# Patient Record
Sex: Male | Born: 1950
Health system: Southern US, Community
[De-identification: ages and names within clinical notes are randomized; demographics above are authoritative.]

## PROBLEM LIST (undated history)

## (undated) DIAGNOSIS — I10 Essential (primary) hypertension: Secondary | ICD-10-CM

## (undated) DIAGNOSIS — IMO0001 Reserved for inherently not codable concepts without codable children: Secondary | ICD-10-CM

## (undated) DIAGNOSIS — Z531 Procedure and treatment not carried out because of patient's decision for reasons of belief and group pressure: Secondary | ICD-10-CM

## (undated) DIAGNOSIS — I82419 Acute embolism and thrombosis of unspecified femoral vein: Secondary | ICD-10-CM

## (undated) HISTORY — DX: Essential (primary) hypertension: I10

## (undated) HISTORY — DX: Procedure and treatment not carried out because of patient's decision for reasons of belief and group pressure: Z53.1

## (undated) HISTORY — PX: APPENDECTOMY: SHX54

## (undated) HISTORY — DX: Reserved for inherently not codable concepts without codable children: IMO0001

---

## 2009-11-02 ENCOUNTER — Ambulatory Visit: Payer: Self-pay | Admitting: Family Medicine

## 2009-11-02 DIAGNOSIS — E669 Obesity, unspecified: Secondary | ICD-10-CM

## 2009-11-02 DIAGNOSIS — R030 Elevated blood-pressure reading, without diagnosis of hypertension: Secondary | ICD-10-CM | POA: Insufficient documentation

## 2009-11-02 LAB — CONVERTED CEMR LAB
ALT: 19 units/L (ref 0–53)
Alkaline Phosphatase: 94 units/L (ref 39–117)
Blood in Urine, dipstick: NEGATIVE
Glucose, Bld: 140 mg/dL — ABNORMAL HIGH (ref 70–99)
Ketones, urine, test strip: NEGATIVE
LDL Cholesterol: 194 mg/dL — ABNORMAL HIGH (ref 0–99)
Nitrite: NEGATIVE
Sodium: 136 meq/L (ref 135–145)
Total Bilirubin: 0.8 mg/dL (ref 0.3–1.2)
Total Protein: 7.4 g/dL (ref 6.0–8.3)
Triglycerides: 96 mg/dL (ref <150)
Urobilinogen, UA: 0.2
VLDL: 19 mg/dL (ref 0–40)
WBC Urine, dipstick: NEGATIVE

## 2010-07-27 NOTE — Assessment & Plan Note (Signed)
Summary: NP,tcb   Vital Signs:  Patient profile:   60 year old male Height:      72 inches Weight:      294.6 pounds BMI:     40.10 Temp:     98.0 degrees F oral Pulse rate:   64 / minute BP sitting:   171 / 99  (left arm) Cuff size:   large  Vitals Entered By: Garen Grams LPN (Nov 02, 6604 9:01 AM)  Serial Vital Signs/Assessments:  Comments: 9:02 AM Manual BP: 168/100 By: Garen Grams LPN   CC: New Patient Is Patient Diabetic? No Pain Assessment Patient in pain? no        CC:  New Patient.  History of Present Illness: For Physical Feels well   Elevated blood pressure was noted when he had it check by nurse at [facility].  Never had hypertension before but has been years since checked.  Does have family history of hypertension Does not add salt but on no special diet.   No leg swelling or urine changes  .  Habits & Providers  Alcohol-Tobacco-Diet     Alcohol drinks/day: <1     Tobacco Status: never  Exercise-Depression-Behavior     Does Patient Exercise: no  Past History:  Past Medical History: No chronic illnesses 10-2009 Appendectomy 1976 DVT in 1970s wears compression stocking  Family History: Family History Diabetes 1st degree relative - Mother HTN in father who died of CAD in his 39s  Social History: Lives with wife Tyrian Peart.  2 adult children.  2 grandchildren 2005 and 2006.  Works as Teaching laboratory technician at [employer].  Does not exercise regularly Smoking Status:  never Does Patient Exercise:  no  Review of Systems  The patient denies anorexia, fever, weight loss, vision loss, decreased hearing, hoarseness, chest pain, dyspnea on exertion, peripheral edema, prolonged cough, headaches, hemoptysis, abdominal pain, melena, hematochezia, severe indigestion/heartburn, hematuria, incontinence, genital sores, muscle weakness, suspicious skin lesions, transient blindness, difficulty walking, depression, unusual weight change, abnormal bleeding, and  enlarged lymph nodes.    Physical Exam  General:  Well-developed,well-nourished,in no acute distress; alert,appropriate and cooperative throughout examination Head:  Normocephalic and atraumatic without obvious abnormalities. No apparent alopecia or balding. Nose:  External nasal examination shows no deformity or inflammation. Nasal mucosa are pink and moist without lesions or exudates. Mouth:  Oral mucosa and oropharynx without lesions or exudates. Missing a number of teeth Neck:  No deformities, masses, or tenderness noted. Lungs:  Normal respiratory effort, chest expands symmetrically. Lungs are clear to auscultation, no crackles or wheezes. Heart:  Normal rate and regular rhythm. S1 and S2 normal without gallop, murmur, click, rub or other extra sounds. Abdomen:  Bowel sounds positive,abdomen soft and non-tender without masses, organomegaly or hernias noted. Obese Rectal:  No external abnormalities noted. Normal sphincter tone. No rectal masses or tenderness. Genitalia:  Testes bilaterally descended without nodularity, tenderness or masses. No scrotal masses or lesions. No penis lesions or urethral discharge. Prostate:  Prostate gland firm and smooth, no enlargement, nodularity, tenderness, mass, asymmetry or induration. Msk:  No deformity or scoliosis noted of thoracic or lumbar spine.   Extremities:  No clubbing, cyanosis, edema, or deformity noted with normal full range of motion of all joints.    Left leg with compression stocking Skin:  Intact without suspicious lesions or rashes Cervical Nodes:  No lymphadenopathy noted   Impression & Recommendations:  Problem # 1:  ELEVATED BLOOD PRESSURE WITHOUT DIAGNOSIS OF HYPERTENSION (ICD-796.2) Very likely  has hypertension.  Will check labs and monitor readings at home  Comp Met-[facility] [phone]) Urinalysis-[facility] (00000)  Problem # 2:  Preventive Health Care (ICD-V70.0) check PSA,  Gave colonoscopy handout.  Check cholesterol today    Problem # 3:  OBESITY (ICD-278.00) discussed importance to weight loss and diet control   Other Orders: PSA-[facility] ([id number]) Lipid-[facility] ([id number])   Patient Instructions: 1)  Please schedule a follow-up appointment in 1 month.  2)  Write down your blood pressure both at home and at work and bring in next visit.  If regularly > 160/95 call us 3)  Cut back on salt and calories 4)  It is important that you exercise reguarly at least 20 minutes 5 times a week. If you develop chest pain, have severe difficulty breathing, or feel very tired, stop exercising immediately and seek medical attention.  5)  I will call you if your lab is abnormal otherwise I will discuss the results during our next visit   Prevention & Chronic Care Immunizations   Influenza vaccine: Not documented    Tetanus booster: Not documented    Pneumococcal vaccine: Not documented  Colorectal Screening   Hemoccult: Not documented    Colonoscopy: Not documented  Other Screening   PSA: Not documented   PSA ordered.   Smoking status: never  (11/02/2009)  Lipids   Total Cholesterol: Not documented   LDL: Not documented   LDL Direct: Not documented   HDL: Not documented   Triglycerides: Not documented   Laboratory Results   Urine Tests  Date/Time Received: Nov 02, 2009 10:23 AM  Date/Time Reported: Nov 02, 2009 10:24 AM   Routine Urinalysis   Color: yellow Appearance: Clear Glucose: negative   (Normal Range: Negative) Bilirubin: negative   (Normal Range: Negative) Ketone: negative   (Normal Range: Negative) Spec. Gravity: 1.025   (Normal Range: 1.003-1.035) Blood: negative   (Normal Range: Negative) pH: 5.5   (Normal Range: 5.0-8.0) Protein: trace   (Normal Range: Negative) Urobilinogen: 0.2   (Normal Range: 0-1) Nitrite: negative   (Normal Range: Negative) Leukocyte Esterace: negative   (Normal Range: Negative)    Comments: ...........test performed by...........Marland KitchenTerese Door,  CMA

## 2012-09-07 ENCOUNTER — Emergency Department (HOSPITAL_COMMUNITY): Payer: Worker's Compensation

## 2012-09-07 ENCOUNTER — Encounter (HOSPITAL_COMMUNITY): Admission: EM | Disposition: A | Discharge status: Home Health | Payer: Self-pay | Source: Home / Self Care

## 2012-09-07 ENCOUNTER — Inpatient Hospital Stay (HOSPITAL_COMMUNITY): Payer: Worker's Compensation | Admitting: Anesthesiology

## 2012-09-07 ENCOUNTER — Encounter (HOSPITAL_COMMUNITY): Payer: Self-pay | Admitting: Anesthesiology

## 2012-09-07 ENCOUNTER — Inpatient Hospital Stay (HOSPITAL_COMMUNITY)
Admission: EM | Admit: 2012-09-07 | Discharge: 2012-09-14 | DRG: 026 | Disposition: A | Discharge status: Home Health | Payer: Worker's Compensation | Attending: General Surgery | Admitting: General Surgery

## 2012-09-07 ENCOUNTER — Encounter (HOSPITAL_COMMUNITY): Payer: Self-pay | Admitting: Emergency Medicine

## 2012-09-07 DIAGNOSIS — Y9269 Other specified industrial and construction area as the place of occurrence of the external cause: Secondary | ICD-10-CM

## 2012-09-07 DIAGNOSIS — S51809A Unspecified open wound of unspecified forearm, initial encounter: Secondary | ICD-10-CM | POA: Diagnosis present

## 2012-09-07 DIAGNOSIS — S2249XA Multiple fractures of ribs, unspecified side, initial encounter for closed fracture: Secondary | ICD-10-CM

## 2012-09-07 DIAGNOSIS — S2241XA Multiple fractures of ribs, right side, initial encounter for closed fracture: Secondary | ICD-10-CM

## 2012-09-07 DIAGNOSIS — S066X9A Traumatic subarachnoid hemorrhage with loss of consciousness of unspecified duration, initial encounter: Secondary | ICD-10-CM

## 2012-09-07 DIAGNOSIS — W19XXXA Unspecified fall, initial encounter: Secondary | ICD-10-CM

## 2012-09-07 DIAGNOSIS — S020XXB Fracture of vault of skull, initial encounter for open fracture: Secondary | ICD-10-CM

## 2012-09-07 DIAGNOSIS — T07XXXA Unspecified multiple injuries, initial encounter: Secondary | ICD-10-CM

## 2012-09-07 DIAGNOSIS — W1789XA Other fall from one level to another, initial encounter: Secondary | ICD-10-CM | POA: Diagnosis present

## 2012-09-07 DIAGNOSIS — S0180XA Unspecified open wound of other part of head, initial encounter: Secondary | ICD-10-CM | POA: Diagnosis present

## 2012-09-07 DIAGNOSIS — S069X9A Unspecified intracranial injury with loss of consciousness of unspecified duration, initial encounter: Secondary | ICD-10-CM

## 2012-09-07 DIAGNOSIS — S06369A Traumatic hemorrhage of cerebrum, unspecified, with loss of consciousness of unspecified duration, initial encounter: Secondary | ICD-10-CM

## 2012-09-07 DIAGNOSIS — S0190XA Unspecified open wound of unspecified part of head, initial encounter: Secondary | ICD-10-CM

## 2012-09-07 DIAGNOSIS — S0291XB Unspecified fracture of skull, initial encounter for open fracture: Secondary | ICD-10-CM

## 2012-09-07 DIAGNOSIS — Z8679 Personal history of other diseases of the circulatory system: Secondary | ICD-10-CM

## 2012-09-07 DIAGNOSIS — S066XAA Traumatic subarachnoid hemorrhage with loss of consciousness status unknown, initial encounter: Secondary | ICD-10-CM

## 2012-09-07 DIAGNOSIS — Y990 Civilian activity done for income or pay: Secondary | ICD-10-CM

## 2012-09-07 DIAGNOSIS — W12XXXA Fall on and from scaffolding, initial encounter: Secondary | ICD-10-CM

## 2012-09-07 HISTORY — DX: Acute embolism and thrombosis of unspecified femoral vein: I82.419

## 2012-09-07 HISTORY — PX: CRANIOTOMY: SHX93

## 2012-09-07 LAB — CBC WITH DIFFERENTIAL/PLATELET
Basophils Absolute: 0.1 K/uL (ref 0.0–0.1)
Basophils Relative: 1 % (ref 0–1)
Eosinophils Absolute: 0.2 10*3/uL (ref 0.0–0.7)
Eosinophils Relative: 3 % (ref 0–5)
HCT: 42.1 % (ref 39.0–52.0)
Hemoglobin: 14.9 g/dL (ref 13.0–17.0)
Lymphocytes Relative: 48 % — ABNORMAL HIGH (ref 12–46)
Lymphs Abs: 4.4 K/uL — ABNORMAL HIGH (ref 0.7–4.0)
MCH: 28.8 pg (ref 26.0–34.0)
MCHC: 35.4 g/dL (ref 30.0–36.0)
MCV: 81.3 fL (ref 78.0–100.0)
Monocytes Absolute: 0.5 10*3/uL (ref 0.1–1.0)
Monocytes Relative: 5 % (ref 3–12)
Neutro Abs: 4 K/uL (ref 1.7–7.7)
Neutrophils Relative %: 44 % (ref 43–77)
Platelets: 199 K/uL (ref 150–400)
RBC: 5.18 MIL/uL (ref 4.22–5.81)
RDW: 12.8 % (ref 11.5–15.5)
WBC: 9.1 K/uL (ref 4.0–10.5)

## 2012-09-07 LAB — APTT: aPTT: 29 seconds (ref 24–37)

## 2012-09-07 LAB — COMPREHENSIVE METABOLIC PANEL WITH GFR
ALT: 37 U/L (ref 0–53)
AST: 65 U/L — ABNORMAL HIGH (ref 0–37)
Alkaline Phosphatase: 102 U/L (ref 39–117)
CO2: 22 meq/L (ref 19–32)
Chloride: 102 meq/L (ref 96–112)
GFR calc Af Amer: 89 mL/min — ABNORMAL LOW (ref >90)
GFR calc non Af Amer: 77 mL/min — ABNORMAL LOW (ref >90)
Glucose, Bld: 143 mg/dL — ABNORMAL HIGH (ref 70–99)
Potassium: 3.9 meq/L (ref 3.5–5.1)
Sodium: 137 meq/L (ref 135–145)
Total Bilirubin: 0.6 mg/dL (ref 0.3–1.2)

## 2012-09-07 LAB — URINALYSIS, ROUTINE W REFLEX MICROSCOPIC
Bilirubin Urine: NEGATIVE
Glucose, UA: NEGATIVE mg/dL
Hgb urine dipstick: NEGATIVE
Ketones, ur: 15 mg/dL — AB
Leukocytes, UA: NEGATIVE
Nitrite: NEGATIVE
Protein, ur: NEGATIVE mg/dL
Specific Gravity, Urine: 1.034 — ABNORMAL HIGH (ref 1.005–1.030)
Urobilinogen, UA: 1 mg/dL (ref 0.0–1.0)
pH: 5.5 (ref 5.0–8.0)

## 2012-09-07 LAB — PROTIME-INR
INR: 1.03 (ref 0.00–1.49)
Prothrombin Time: 13.4 s (ref 11.6–15.2)

## 2012-09-07 LAB — COMPREHENSIVE METABOLIC PANEL
Albumin: 3.8 g/dL (ref 3.5–5.2)
BUN: 15 mg/dL (ref 6–23)
Calcium: 8.7 mg/dL (ref 8.4–10.5)
Creatinine, Ser: 1.03 mg/dL (ref 0.50–1.35)
Total Protein: 7.5 g/dL (ref 6.0–8.3)

## 2012-09-07 LAB — SAMPLE TO BLOOD BANK

## 2012-09-07 SURGERY — CRANIOTOMY HEMATOMA EVACUATION SUBDURAL
Anesthesia: General | Site: Head | Laterality: Right | Wound class: Dirty or Infected

## 2012-09-07 MED ORDER — POLYETHYLENE GLYCOL 3350 17 G PO PACK
17.0000 g | PACK | Freq: Every day | ORAL | Status: DC
  Administered 2012-09-08 – 2012-09-11 (×4): 17 g via ORAL
  Filled 2012-09-07 (×8): qty 1

## 2012-09-07 MED ORDER — GLYCOPYRROLATE 0.2 MG/ML IJ SOLN
INTRAMUSCULAR | Status: DC | PRN
  Administered 2012-09-07: .6 mg via INTRAVENOUS

## 2012-09-07 MED ORDER — 0.9 % SODIUM CHLORIDE (POUR BTL) OPTIME
TOPICAL | Status: DC | PRN
  Administered 2012-09-07 (×2): 1000 mL

## 2012-09-07 MED ORDER — NALOXONE HCL 0.4 MG/ML IJ SOLN: 0.4000 mg | INTRAMUSCULAR | Status: DC | PRN

## 2012-09-07 MED ORDER — PHENYLEPHRINE HCL 10 MG/ML IJ SOLN
INTRAMUSCULAR | Status: DC | PRN
  Administered 2012-09-07 (×2): 40 ug via INTRAVENOUS

## 2012-09-07 MED ORDER — LABETALOL HCL 5 MG/ML IV SOLN
INTRAVENOUS | Status: DC | PRN
  Administered 2012-09-07 (×2): 5 mg via INTRAVENOUS

## 2012-09-07 MED ORDER — LIDOCAINE HCL (PF) 1 % IJ SOLN
INTRAMUSCULAR | Status: AC
  Filled 2012-09-07: qty 5

## 2012-09-07 MED ORDER — BACITRACIN ZINC 500 UNIT/GM EX OINT
TOPICAL_OINTMENT | CUTANEOUS | Status: DC | PRN
  Administered 2012-09-07: 1 via TOPICAL

## 2012-09-07 MED ORDER — THROMBIN 20000 UNITS EX SOLR
CUTANEOUS | Status: DC | PRN
  Administered 2012-09-07: 18:00:00 via TOPICAL

## 2012-09-07 MED ORDER — FENTANYL CITRATE 0.05 MG/ML IJ SOLN: 100.0000 ug | Freq: Once | INTRAMUSCULAR | Status: DC

## 2012-09-07 MED ORDER — ONDANSETRON HCL 4 MG/2ML IJ SOLN: 4.0000 mg | Freq: Once | INTRAMUSCULAR | Status: DC | PRN

## 2012-09-07 MED ORDER — ONDANSETRON HCL 4 MG/2ML IJ SOLN
INTRAMUSCULAR | Status: DC | PRN
  Administered 2012-09-07: 4 mg via INTRAVENOUS

## 2012-09-07 MED ORDER — SODIUM CHLORIDE 0.9 % IV SOLN
INTRAVENOUS | Status: DC | PRN
  Administered 2012-09-07: 18:00:00 via INTRAVENOUS

## 2012-09-07 MED ORDER — LIDOCAINE HCL 4 % MT SOLN
OROMUCOSAL | Status: DC | PRN
  Administered 2012-09-07: 4 mL via TOPICAL

## 2012-09-07 MED ORDER — PROPOFOL 10 MG/ML IV BOLUS
INTRAVENOUS | Status: DC | PRN
  Administered 2012-09-07: 240 mg via INTRAVENOUS

## 2012-09-07 MED ORDER — SODIUM CHLORIDE 0.9 % IR SOLN
Status: DC | PRN
  Administered 2012-09-07: 18:00:00

## 2012-09-07 MED ORDER — LIDOCAINE HCL (CARDIAC) 20 MG/ML IV SOLN
INTRAVENOUS | Status: DC | PRN
  Administered 2012-09-07: 60 mg via INTRAVENOUS

## 2012-09-07 MED ORDER — ROCURONIUM BROMIDE 100 MG/10ML IV SOLN
INTRAVENOUS | Status: DC | PRN
  Administered 2012-09-07: 20 mg via INTRAVENOUS
  Administered 2012-09-07: 30 mg via INTRAVENOUS
  Administered 2012-09-07: 10 mg via INTRAVENOUS

## 2012-09-07 MED ORDER — DIPHENHYDRAMINE HCL 50 MG/ML IJ SOLN: 12.5000 mg | Freq: Four times a day (QID) | INTRAMUSCULAR | Status: DC | PRN

## 2012-09-07 MED ORDER — SODIUM CHLORIDE 0.9 % IV SOLN
INTRAVENOUS | Status: AC | PRN
  Administered 2012-09-07: 50 mL/h via INTRAVENOUS

## 2012-09-07 MED ORDER — TETANUS-DIPHTH-ACELL PERTUSSIS 5-2.5-18.5 LF-MCG/0.5 IM SUSP
0.5000 mL | Freq: Once | INTRAMUSCULAR | Status: AC
  Administered 2012-09-07: 0.5 mL via INTRAMUSCULAR
  Filled 2012-09-07: qty 0.5

## 2012-09-07 MED ORDER — EPHEDRINE SULFATE 50 MG/ML IJ SOLN
INTRAMUSCULAR | Status: DC | PRN
  Administered 2012-09-07 (×2): 5 mg via INTRAVENOUS

## 2012-09-07 MED ORDER — LIDOCAINE HCL (PF) 1 % IJ SOLN
INTRAMUSCULAR | Status: DC | PRN
  Administered 2012-09-07: 10 mL

## 2012-09-07 MED ORDER — HYDROMORPHONE HCL PF 1 MG/ML IJ SOLN: 0.2500 mg | INTRAMUSCULAR | Status: DC | PRN

## 2012-09-07 MED ORDER — FENTANYL CITRATE 0.05 MG/ML IJ SOLN
INTRAMUSCULAR | Status: AC | PRN
  Administered 2012-09-07: 100 ug via INTRAVENOUS

## 2012-09-07 MED ORDER — LIDOCAINE HCL (PF) 1 % IJ SOLN
INTRAMUSCULAR | Status: AC
  Administered 2012-09-07: 14:00:00
  Filled 2012-09-07: qty 5

## 2012-09-07 MED ORDER — HYDROMORPHONE HCL PF 1 MG/ML IJ SOLN
INTRAMUSCULAR | Status: AC
  Administered 2012-09-07: 1 mg via INTRAVENOUS
  Filled 2012-09-07: qty 1

## 2012-09-07 MED ORDER — NEOSTIGMINE METHYLSULFATE 1 MG/ML IJ SOLN
INTRAMUSCULAR | Status: DC | PRN
  Administered 2012-09-07: 4 mg via INTRAVENOUS

## 2012-09-07 MED ORDER — SODIUM CHLORIDE 0.9 % IJ SOLN: 9.0000 mL | INTRAMUSCULAR | Status: DC | PRN

## 2012-09-07 MED ORDER — DOCUSATE SODIUM 100 MG PO CAPS
100.0000 mg | ORAL_CAPSULE | Freq: Two times a day (BID) | ORAL | Status: DC
  Administered 2012-09-08 – 2012-09-11 (×7): 100 mg via ORAL
  Filled 2012-09-07 (×7): qty 1

## 2012-09-07 MED ORDER — IOHEXOL 300 MG/ML  SOLN
100.0000 mL | Freq: Once | INTRAMUSCULAR | Status: AC | PRN
  Administered 2012-09-07: 100 mL via INTRAVENOUS

## 2012-09-07 MED ORDER — POTASSIUM CHLORIDE IN NACL 20-0.9 MEQ/L-% IV SOLN
INTRAVENOUS | Status: DC
  Administered 2012-09-07: 20:00:00 via INTRAVENOUS
  Filled 2012-09-07 (×3): qty 1000

## 2012-09-07 MED ORDER — MICROFIBRILLAR COLL HEMOSTAT EX PADS
MEDICATED_PAD | CUTANEOUS | Status: DC | PRN
  Administered 2012-09-07: 1 via TOPICAL

## 2012-09-07 MED ORDER — DIPHENHYDRAMINE HCL 12.5 MG/5ML PO ELIX
12.5000 mg | ORAL_SOLUTION | Freq: Four times a day (QID) | ORAL | Status: DC | PRN
  Filled 2012-09-07: qty 5

## 2012-09-07 MED ORDER — HYDROMORPHONE 0.3 MG/ML IV SOLN
INTRAVENOUS | Status: DC
  Administered 2012-09-07: 22:00:00 via INTRAVENOUS
  Administered 2012-09-08 (×3): 0.9 mg via INTRAVENOUS
  Administered 2012-09-08: 0.6 mg via INTRAVENOUS
  Filled 2012-09-07: qty 25

## 2012-09-07 MED ORDER — DEXTROSE 5 % IV SOLN
1.0000 g | Freq: Once | INTRAVENOUS | Status: AC
  Administered 2012-09-07: 1 g via INTRAVENOUS
  Filled 2012-09-07: qty 10

## 2012-09-07 MED ORDER — ONDANSETRON HCL 4 MG/2ML IJ SOLN: 4.0000 mg | Freq: Four times a day (QID) | INTRAMUSCULAR | Status: DC | PRN

## 2012-09-07 MED ORDER — HYDROMORPHONE HCL PF 1 MG/ML IJ SOLN: 1.0000 mg | Freq: Once | INTRAMUSCULAR | Status: AC

## 2012-09-07 MED ORDER — LACTATED RINGERS IV SOLN
INTRAVENOUS | Status: DC | PRN
  Administered 2012-09-07: 17:00:00 via INTRAVENOUS

## 2012-09-07 MED ORDER — FENTANYL CITRATE 0.05 MG/ML IJ SOLN
INTRAMUSCULAR | Status: DC | PRN
  Administered 2012-09-07: 50 ug via INTRAVENOUS
  Administered 2012-09-07 (×2): 100 ug via INTRAVENOUS

## 2012-09-07 MED ORDER — LIDOCAINE HCL (PF) 1 % IJ SOLN
5.0000 mL | Freq: Once | INTRAMUSCULAR | Status: AC
  Administered 2012-09-07: 5 mL
  Filled 2012-09-07: qty 5

## 2012-09-07 MED ORDER — FENTANYL CITRATE 0.05 MG/ML IJ SOLN
INTRAMUSCULAR | Status: AC
  Filled 2012-09-07: qty 2

## 2012-09-07 MED ORDER — SUCCINYLCHOLINE CHLORIDE 20 MG/ML IJ SOLN
INTRAMUSCULAR | Status: DC | PRN
  Administered 2012-09-07: 100 mg via INTRAVENOUS

## 2012-09-07 SURGICAL SUPPLY — 73 items
APL SKNCLS STERI-STRIP NONHPOA (GAUZE/BANDAGES/DRESSINGS) ×1
BAG DECANTER FOR FLEXI CONT (MISCELLANEOUS) ×2 IMPLANT
BANDAGE GAUZE 4  KLING STR (GAUZE/BANDAGES/DRESSINGS) IMPLANT
BANDAGE GAUZE ELAST BULKY 4 IN (GAUZE/BANDAGES/DRESSINGS) ×2 IMPLANT
BENZOIN TINCTURE PRP APPL 2/3 (GAUZE/BANDAGES/DRESSINGS) ×2 IMPLANT
BIT DRILL WIRE PASS 1.3MM (BIT) ×1 IMPLANT
BLADE SURG ROTATE 9660 (MISCELLANEOUS) ×2 IMPLANT
BRUSH SCRUB EZ 1% IODOPHOR (MISCELLANEOUS) IMPLANT
BRUSH SCRUB EZ PLAIN DRY (MISCELLANEOUS) ×2 IMPLANT
BUR ROUTER D-58 CRANI (BURR) ×1 IMPLANT
CANISTER SUCTION 2500CC (MISCELLANEOUS) ×3 IMPLANT
CLIP TI MEDIUM 6 (CLIP) IMPLANT
CLOTH BEACON ORANGE TIMEOUT ST (SAFETY) ×2 IMPLANT
CONT SPEC 4OZ CLIKSEAL STRL BL (MISCELLANEOUS) ×2 IMPLANT
CORDS BIPOLAR (ELECTRODE) ×2 IMPLANT
COVER TABLE BACK 60X90 (DRAPES) ×4 IMPLANT
DRAPE NEUROLOGICAL W/INCISE (DRAPES) ×2 IMPLANT
DRAPE SURG 17X23 STRL (DRAPES) IMPLANT
DRAPE WARM FLUID 44X44 (DRAPE) ×2 IMPLANT
DRESSING TELFA 8X3 (GAUZE/BANDAGES/DRESSINGS) ×2 IMPLANT
DRILL WIRE PASS 1.3MM (BIT) ×2
ELECT CAUTERY BLADE 6.4 (BLADE) ×2 IMPLANT
ELECT REM PT RETURN 9FT ADLT (ELECTROSURGICAL) ×2
ELECTRODE REM PT RTRN 9FT ADLT (ELECTROSURGICAL) ×1 IMPLANT
GAUZE SPONGE 4X4 16PLY XRAY LF (GAUZE/BANDAGES/DRESSINGS) IMPLANT
GLOVE BIO SURGEON STRL SZ8 (GLOVE) ×1 IMPLANT
GLOVE BIOGEL PI IND STRL 7.0 (GLOVE) IMPLANT
GLOVE BIOGEL PI IND STRL 8.5 (GLOVE) IMPLANT
GLOVE BIOGEL PI INDICATOR 7.0 (GLOVE) ×1
GLOVE BIOGEL PI INDICATOR 8.5 (GLOVE) ×1
GLOVE ECLIPSE 7.5 STRL STRAW (GLOVE) ×2 IMPLANT
GLOVE EXAM NITRILE LRG STRL (GLOVE) IMPLANT
GLOVE EXAM NITRILE XL STR (GLOVE) IMPLANT
GLOVE EXAM NITRILE XS STR PU (GLOVE) ×1 IMPLANT
GLOVE SURG SS PI 6.5 STRL IVOR (GLOVE) ×2 IMPLANT
GOWN BRE IMP SLV AUR LG STRL (GOWN DISPOSABLE) IMPLANT
GOWN BRE IMP SLV AUR XL STRL (GOWN DISPOSABLE) ×2 IMPLANT
GOWN STRL REIN 2XL LVL4 (GOWN DISPOSABLE) ×2 IMPLANT
HEMOSTAT SURGICEL 2X14 (HEMOSTASIS) ×2 IMPLANT
KIT BASIN OR (CUSTOM PROCEDURE TRAY) ×2 IMPLANT
KIT ROOM TURNOVER OR (KITS) ×2 IMPLANT
NEEDLE HYPO 22GX1.5 SAFETY (NEEDLE) ×2 IMPLANT
NS IRRIG 1000ML POUR BTL (IV SOLUTION) ×3 IMPLANT
PACK CRANIOTOMY (CUSTOM PROCEDURE TRAY) ×2 IMPLANT
PAD ARMBOARD 7.5X6 YLW CONV (MISCELLANEOUS) ×3 IMPLANT
PAD EYE OVAL STERILE LF (GAUZE/BANDAGES/DRESSINGS) IMPLANT
PATTIES SURGICAL .25X.25 (GAUZE/BANDAGES/DRESSINGS) IMPLANT
PATTIES SURGICAL .5 X.5 (GAUZE/BANDAGES/DRESSINGS) IMPLANT
PATTIES SURGICAL .5 X3 (DISPOSABLE) IMPLANT
PATTIES SURGICAL .75X.75 (GAUZE/BANDAGES/DRESSINGS) ×1 IMPLANT
PATTIES SURGICAL 1X1 (DISPOSABLE) IMPLANT
PERFORATOR LRG  14-11MM (BIT) ×1
PERFORATOR LRG 14-11MM (BIT) ×1 IMPLANT
PIN MAYFIELD SKULL DISP (PIN) IMPLANT
PLATE 1.5  2HOLE LNG NEURO (Plate) ×2 IMPLANT
PLATE 1.5 2HOLE LNG NEURO (Plate) IMPLANT
PLATE 1.5/0.5 25MM BURR HOLE (Plate) ×1 IMPLANT
RUBBERBAND STERILE (MISCELLANEOUS) ×2 IMPLANT
SCREW SELF DRILL HT 1.5/4MM (Screw) ×8 IMPLANT
SPECIMEN JAR SMALL (MISCELLANEOUS) IMPLANT
SPONGE GAUZE 4X4 12PLY (GAUZE/BANDAGES/DRESSINGS) ×2 IMPLANT
SPONGE NEURO XRAY DETECT 1X3 (DISPOSABLE) IMPLANT
SPONGE SURGIFOAM ABS GEL 100 (HEMOSTASIS) ×2 IMPLANT
STAPLER SKIN PROX WIDE 3.9 (STAPLE) ×2 IMPLANT
SUT NURALON 4 0 TR CR/8 (SUTURE) ×3 IMPLANT
SUT VIC AB 2-0 OS6 18 (SUTURE) ×9 IMPLANT
SYR 20ML ECCENTRIC (SYRINGE) ×2 IMPLANT
SYR CONTROL 10ML LL (SYRINGE) ×2 IMPLANT
TOWEL OR 17X24 6PK STRL BLUE (TOWEL DISPOSABLE) ×2 IMPLANT
TOWEL OR 17X26 10 PK STRL BLUE (TOWEL DISPOSABLE) ×2 IMPLANT
TRAY FOLEY CATH 14FRSI W/METER (CATHETERS) IMPLANT
UNDERPAD 30X30 INCONTINENT (UNDERPADS AND DIAPERS) IMPLANT
WATER STERILE IRR 1000ML POUR (IV SOLUTION) ×2 IMPLANT

## 2012-09-07 NOTE — ED Notes (Signed)
Abigail, PA and student PA at the bedside to suture wounds.

## 2012-09-07 NOTE — Op Note (Signed)
Preop diagnosis: Right frontotemporal depressed skull fracture with dural laceration Postop diagnosis: Same Procedure: Right frontotemporal craniectomy with elevation of depressed skull fracture Surgeon: Kritzer Assistant: Venetia Maxon  After and placed the supine position with the head turned towards the left the patient's right scalp was shaved prepped and draped in the usual sterile fashion. A curvilinear incision was made starting just anterior to the right ear and up into the parietal region and then anteriorly towards the hairline. The incision was carried down through the temporal fascia and muscle entire flap was reflected anteriorly. Has reflected his flap we encountered the cranial defect from the large depressed skull fracture. We placed a towel clips and rubber bands and reflected the flap anteriorly. We were then able to begin to dissect pieces of the bone away from the dura and from underneath the intact skull. We did this in a piecemeal fashion. It was dural laceration noted in 2 places and small cortical bleeding underneath. We coagulated small bleeders without difficulty and placed Gelfoam around the cranial defect. We then found a piece of rubber mat in gym between to skull fracture fragment. We irrigated copiously metabolic irrigation. We were able to salvage one of the larger pieces of fractured bone and reattached that to the cranium with 2 small Lorenz plate. We then took a large bur hole cover and placed over the remaining defect and secured that with a 4 small screws. We irrigated once more placing Gelfoam over the residual dural exposure. We then closed with inverted Vicryl on the temporal fascia and galea and staples on the skin. A sterile dressing was then applied and the patient was extubated and taken to recovery room in stable condition.

## 2012-09-07 NOTE — ED Notes (Signed)
Per [facility], pt fell 25-30 ft from scaffolding and landed on a type of metal brick. When EMS arrived pt was lying supine. Pt has a laceration to the right forehead, bleeding controlled. C/o pain to the right shoulder, right rib area. Also to the mid lateral back area after being log rolled. No spinal tenderness. Pt is A/O x 4 with an 18g to LAC.

## 2012-09-07 NOTE — Anesthesia Preprocedure Evaluation (Addendum)
Anesthesia Evaluation  Patient identified by MRN, date of birth, ID band  Reviewed: Allergy & Precautions, H&P , NPO status , Patient's Chart, lab work & pertinent test results  Airway Mallampati: I TM Distance: >3 FB Neck ROM: full    Dental  (+) Teeth Intact and Dental Advisory Given   Pulmonary neg pulmonary ROS,  breath sounds clear to auscultation        Cardiovascular negative cardio ROS  Rhythm:regular Rate:Tachycardia     Neuro/Psych    GI/Hepatic negative GI ROS, Neg liver ROS,   Endo/Other  negative endocrine ROS  Renal/GU negative Renal ROS     Musculoskeletal negative musculoskeletal ROS (+)   Abdominal (+)  Abdomen: soft. Bowel sounds: normal.  Peds  Hematology negative hematology ROS (+)   Anesthesia Other Findings   Reproductive/Obstetrics negative OB ROS                         Anesthesia Physical Anesthesia Plan  ASA: III  Anesthesia Plan: General   Post-op Pain Management:    Induction: Intravenous  Airway Management Planned: Oral ETT  Additional Equipment:   Intra-op Plan:   Post-operative Plan: Extubation in OR and Possible Post-op intubation/ventilation  Informed Consent: I have reviewed the patients History and Physical, chart, labs and discussed the procedure including the risks, benefits and alternatives for the proposed anesthesia with the patient or authorized representative who has indicated his/her understanding and acceptance.     Plan Discussed with: CRNA, Anesthesiologist and Surgeon  Anesthesia Plan Comments:         Anesthesia Quick Evaluation

## 2012-09-07 NOTE — ED Notes (Signed)
Wife at the bedside at this time.  

## 2012-09-07 NOTE — Anesthesia Procedure Notes (Signed)
Procedure Name: Intubation Date/Time: 09/07/2012 5:12 PM Performed by: Ellin Goodie Pre-anesthesia Checklist: Patient identified, Emergency Drugs available, Suction available, Patient being monitored and Timeout performed Patient Re-evaluated:Patient Re-evaluated prior to inductionOxygen Delivery Method: Circle system utilized Preoxygenation: Pre-oxygenation with 100% oxygen Intubation Type: IV induction, Rapid sequence and Cricoid Pressure applied Laryngoscope Size: Mac and 4 Grade View: Grade I Tube type: Oral Tube size: 7.5 mm Number of attempts: 1 Airway Equipment and Method: Stylet Placement Confirmation: ETT inserted through vocal cords under direct vision,  positive ETCO2 and breath sounds checked- equal and bilateral Secured at: 23 cm Tube secured with: Tape Dental Injury: Teeth and Oropharynx as per pre-operative assessment

## 2012-09-07 NOTE — Preoperative (Addendum)
Beta Blockers   Reason not to administer Beta Blockers:Not Applicable 

## 2012-09-07 NOTE — H&P (Signed)
Frank Scott is an 62 y.o. male.   Chief Complaint: Fall HPI: Frank was at work at [employer] on a scaffold about 20' high when it broke and he fell. He does not remember falling or hitting the ground. Witnesses reported a loss of consciousness and then confusion at the scene. Workup showed an open depressed skull fx with underlying TBI and multiple right rib fractures. We were asked to admit.  History reviewed. No pertinent past medical history.  Past Surgical History  Procedure Laterality Date  . Appendectomy      History reviewed. No pertinent family history. Social History:  reports that he has never smoked. He does not have any smokeless tobacco history on file. He reports that  drinks alcohol. He reports that he does not use illicit drugs.  Allergies: No Known Allergies   Results for orders placed during the hospital encounter of 09/07/12 (from the past 48 hour(s))  SAMPLE TO BLOOD BANK     Status: None   Collection Time    09/07/12 11:45 AM      Result Value Range   Blood Bank Specimen SAMPLE AVAILABLE FOR TESTING     Sample Expiration 09/08/2012    CBC WITH DIFFERENTIAL     Status: Abnormal   Collection Time    09/07/12 11:52 AM      Result Value Range   WBC 9.1  4.0 - 10.5 K/uL   RBC 5.18  4.22 - 5.81 MIL/uL   Hemoglobin 14.9  13.0 - 17.0 g/dL   HCT 16.1  09.6 - 04.5 %   MCV 81.3  78.0 - 100.0 fL   MCH 28.8  26.0 - 34.0 pg   MCHC 35.4  30.0 - 36.0 g/dL   RDW 40.9  81.1 - 91.4 %   Platelets 199  150 - 400 K/uL   Neutrophils Relative 44  43 - 77 %   Neutro Abs 4.0  1.7 - 7.7 K/uL   Lymphocytes Relative 48 (*) 12 - 46 %   Lymphs Abs 4.4 (*) 0.7 - 4.0 K/uL   Monocytes Relative 5  3 - 12 %   Monocytes Absolute 0.5  0.1 - 1.0 K/uL   Eosinophils Relative 3  0 - 5 %   Eosinophils Absolute 0.2  0.0 - 0.7 K/uL   Basophils Relative 1  0 - 1 %   Basophils Absolute 0.1  0.0 - 0.1 K/uL  COMPREHENSIVE METABOLIC PANEL     Status: Abnormal   Collection Time    09/07/12 11:52 AM       Result Value Range   Sodium 137  135 - 145 mEq/L   Potassium 3.9  3.5 - 5.1 mEq/L   Chloride 102  96 - 112 mEq/L   CO2 22  19 - 32 mEq/L   Glucose, Bld 143 (*) 70 - 99 mg/dL   BUN 15  6 - 23 mg/dL   Creatinine, Ser 7.82  0.50 - 1.35 mg/dL   Calcium 8.7  8.4 - 95.6 mg/dL   Total Protein 7.5  6.0 - 8.3 g/dL   Albumin 3.8  3.5 - 5.2 g/dL   AST 65 (*) 0 - 37 U/L   ALT 37  0 - 53 U/L   Alkaline Phosphatase 102  39 - 117 U/L   Total Bilirubin 0.6  0.3 - 1.2 mg/dL   GFR calc non Af Amer 77 (*) >90 mL/min   GFR calc Af Amer 89 (*) >90 mL/min   Comment:  The eGFR has been calculated     using the CKD EPI equation.     This calculation has not been     validated in all clinical     situations.     eGFR's persistently     <90 mL/min signify     possible Chronic Kidney Disease.  APTT     Status: None   Collection Time    09/07/12 11:52 AM      Result Value Range   aPTT 29  24 - 37 seconds  PROTIME-INR     Status: None   Collection Time    09/07/12 11:52 AM      Result Value Range   Prothrombin Time 13.4  11.6 - 15.2 seconds   INR 1.03  0.00 - 1.49   Ct Head Wo Contrast  09/07/2012  *RADIOLOGY REPORT*  Clinical Data:  Status post fall.  CT HEAD WITHOUT CONTRAST CT CERVICAL SPINE WITHOUT CONTRAST  Technique:  Multidetector CT imaging of the head and cervical spine was performed following the standard protocol without intravenous contrast.  Multiplanar CT image reconstructions of the cervical spine were also generated.  Comparison:   None  CT HEAD  Findings: The patient has a comminuted and depressed right frontal bone fracture.  Depression is estimated at approximately 1 cm. Associated pneumocephalus is noted.  There is underlying frontal lobe contusion with the largest area involved measuring approximately 3 x 1.5 cm.  Subarachnoid blood is also seen about the site of the fracture.  No other hemorrhage is identified. There is no midline shift or hydrocephalus.  The calvarium  is otherwise intact.  IMPRESSION:  Depressed right frontal bone fracture with associated parenchymal contusion and small amount of subarachnoid blood.  CT CERVICAL SPINE  Findings: No fracture or subluxation of the cervical spine is identified.  There is some loss of disc space height and endplate spurring at C5-6.  No epidural hematoma is visualized.  IMPRESSION: No acute finding.   Original Report Authenticated By: Holley Dexter, M.D.    Ct Chest W Contrast  09/07/2012  *RADIOLOGY REPORT*  Clinical Data:  Fall from approximately 30 feet, right flank/chest pain  CT CHEST, ABDOMEN AND PELVIS WITH CONTRAST  Technique:  Multidetector CT imaging of the chest, abdomen and pelvis was performed following the standard protocol during bolus administration of intravenous contrast.  Contrast: OMNIPAQUE IOHEXOL 300 MG/ML  SOLN  Comparison:  None.  CT CHEST  Findings:  No evidence of traumatic aortic injury.  No mediastinal hematoma.  Patchy opacities in the posterior upper and lower lobes, favored to reflect dependent atelectasis, less likely aspiration.  Trace right pleural effusion.  No pneumothorax.  Visualized thyroid is unremarkable.  The heart is top normal in size.  No pericardial effusion.  No suspicious mediastinal or axillary lymphadenopathy.  Nondisplaced fractures of the anterolateral right 4th rib (series 3/image 30) and the lateral 5th-9th ribs.  IMPRESSION: Nondisplaced fractures of the right 4th-9th ribs.  Trace right pleural effusion.  Patchy opacities in the posterior upper and lower lobes, favored to reflect a dependent atelectasis, less likely aspiration.  CT ABDOMEN AND PELVIS  Findings:  Liver, spleen, and adrenal glands are within normal limits.  Calcification in the pancreatic head (series 3/image 64), possibly sequela of prior/chronic pancreatitis.  Gallbladder is unremarkable.  No intrahepatic or extrahepatic ductal dilatation.  Kidneys are within normal limits.  No hydronephrosis.  No  evidence of bowel obstruction.  No abdominopelvic ascites.  No hemoperitoneum.  No  free air.  Atherosclerotic calcifications of the abdominal aorta and branch vessels. Intraluminal calcification within the left femoral vein (series 3/image 125 and 135) with associated left lower abdominopelvic collaterals (series 3/image 120), favored to reflect sequela of chronic deep venous thrombosis with collateralization. The bilateral iliac venous system is narrowed but symmetric.  No suspicious abdominopelvic lymphadenopathy.  Prostate is unremarkable.  Bladder is within normal limits.  Mild degenerative changes of the lumbar spine.  No fracture is seen.  IMPRESSION: No evidence of traumatic injury to the abdomen/pelvis.  Suspected sequela of chronic left femoral DVT with collateralization.   Original Report Authenticated By: Charline Bills, M.D.     Ct Abdomen Pelvis W Contrast  09/07/2012  *RADIOLOGY REPORT*  Clinical Data:  Fall from approximately 30 feet, right flank/chest pain  CT CHEST, ABDOMEN AND PELVIS WITH CONTRAST  Technique:  Multidetector CT imaging of the chest, abdomen and pelvis was performed following the standard protocol during bolus administration of intravenous contrast.  Contrast: OMNIPAQUE IOHEXOL 300 MG/ML  SOLN  Comparison:  None.  CT CHEST  Findings:  No evidence of traumatic aortic injury.  No mediastinal hematoma.  Patchy opacities in the posterior upper and lower lobes, favored to reflect dependent atelectasis, less likely aspiration.  Trace right pleural effusion.  No pneumothorax.  Visualized thyroid is unremarkable.  The heart is top normal in size.  No pericardial effusion.  No suspicious mediastinal or axillary lymphadenopathy.  Nondisplaced fractures of the anterolateral right 4th rib (series 3/image 30) and the lateral 5th-9th ribs.  IMPRESSION: Nondisplaced fractures of the right 4th-9th ribs.  Trace right pleural effusion.  Patchy opacities in the posterior upper and lower  lobes, favored to reflect a dependent atelectasis, less likely aspiration.  CT ABDOMEN AND PELVIS  Findings:  Liver, spleen, and adrenal glands are within normal limits.  Calcification in the pancreatic head (series 3/image 64), possibly sequela of prior/chronic pancreatitis.  Gallbladder is unremarkable.  No intrahepatic or extrahepatic ductal dilatation.  Kidneys are within normal limits.  No hydronephrosis.  No evidence of bowel obstruction.  No abdominopelvic ascites.  No hemoperitoneum.  No free air.  Atherosclerotic calcifications of the abdominal aorta and branch vessels. Intraluminal calcification within the left femoral vein (series 3/image 125 and 135) with associated left lower abdominopelvic collaterals (series 3/image 120), favored to reflect sequela of chronic deep venous thrombosis with collateralization. The bilateral iliac venous system is narrowed but symmetric.  No suspicious abdominopelvic lymphadenopathy.  Prostate is unremarkable.  Bladder is within normal limits.  Mild degenerative changes of the lumbar spine.  No fracture is seen.  IMPRESSION: No evidence of traumatic injury to the abdomen/pelvis.  Suspected sequela of chronic left femoral DVT with collateralization.   Original Report Authenticated By: Charline Bills, M.D.    Dg Chest Portable 1 View  09/07/2012  *RADIOLOGY REPORT*  Clinical Data: Status post fall.  PORTABLE CHEST - 1 VIEW  Comparison: None.  Findings: Bilateral upper lobe airspace opacities are identified. No pneumothorax is seen.  There is cardiomegaly.  The mediastinum appears widened which is likely due to portable technique and supine positioning.  IMPRESSION:  1.  Bilateral upper lobe opacities could reflect pulmonary contusions in the setting of trauma. 2.  Widened mediastinum is likely due to technical factors.   Original Report Authenticated By: Holley Dexter, M.D.     Review of Systems  HENT: Positive for neck pain.   Respiratory: Negative for shortness  of breath.   Cardiovascular: Positive for chest pain.  Neurological: Positive for loss of consciousness.  Psychiatric/Behavioral: Positive for memory loss.  All other systems reviewed and are negative.    Blood pressure 146/84, pulse 76, temperature 97.9 F (36.6 C), resp. rate 20, SpO2 98.00%. Physical Exam  Vitals reviewed. Constitutional: He is oriented to person, place, and time. He appears well-developed and well-nourished. He is cooperative. No distress. Cervical collar in place.  HENT:  Head: Normocephalic. Head is with laceration. Head is without raccoon's eyes, without Battle's sign, without abrasion and without contusion.    Right Ear: Hearing, tympanic membrane, external ear and ear canal normal. No lacerations. No drainage or tenderness. No foreign bodies. Tympanic membrane is not perforated. No hemotympanum.  Left Ear: Hearing, tympanic membrane, external ear and ear canal normal. No lacerations. No drainage or tenderness. No foreign bodies. Tympanic membrane is not perforated. No hemotympanum.  Nose: Nose normal. No nose lacerations, sinus tenderness, nasal deformity or nasal septal hematoma. No epistaxis.  Mouth/Throat: Uvula is midline, oropharynx is clear and moist and mucous membranes are normal. No lacerations. No oropharyngeal exudate.  Eyes: Conjunctivae, EOM and lids are normal. Pupils are equal, round, and reactive to light. Right eye exhibits no discharge. Left eye exhibits no discharge. No scleral icterus.  Neck: Trachea normal. No JVD present. Muscular tenderness present. No spinous process tenderness present. Carotid bruit is not present. Decreased range of motion (Right neck pain with rotation) present. No thyromegaly present.  Cardiovascular: Normal rate, regular rhythm, normal heart sounds, intact distal pulses and normal pulses.  Exam reveals no gallop and no friction rub.   No murmur heard. Respiratory: Effort normal and breath sounds normal. No respiratory  distress. He has no wheezes. He has no rales. He exhibits tenderness. He exhibits no bony tenderness, no laceration and no crepitus.  GI: Soft. Normal appearance. He exhibits no distension. Bowel sounds are decreased. There is no tenderness. There is no rigidity, no rebound, no guarding and no CVA tenderness.  Genitourinary: Penis normal.  Musculoskeletal: He exhibits no edema and no tenderness.  Lymphadenopathy:    He has no cervical adenopathy.  Neurological: He is alert and oriented to person, place, and time. He has normal strength. No cranial nerve deficit or sensory deficit. GCS eye subscore is 4. GCS verbal subscore is 5. GCS motor subscore is 6.  Skin: Skin is warm, dry and intact. He is not diaphoretic.  Psychiatric: He has a normal mood and affect. His speech is normal and behavior is normal. Judgment and thought content normal. He exhibits abnormal recent memory.     Assessment/Plan Fall Open depressed skull fx w/SAH, ICC Multiple right rib fxs  Will admit to trauma to neuro ICU. NS to evaluate, pain control and pulmonary toilet.   Freeman Caldron, PA-C Pager: [phone] General Trauma PA Pager: [phone]  09/07/2012, 3:39 PM   The patient has a markedly depressed skull fracture with intraparenchymal hemorrhage and SAH.  Neurosurgery has been contacted and will call me back.  Patient is stable with GCS of 15 although he has received pain medications.  Will be admitted to trauma service to NICU.  This patient has been seen and I agree with the findings and treatment plan.  [name]. Gae Bon, MD, FACS [phone] (pager) [phone] (direct pager) Trauma Surgeon

## 2012-09-07 NOTE — Anesthesia Postprocedure Evaluation (Signed)
  Anesthesia Post-op Note  Patient: Frank Scott  Procedure(s) Performed: Procedure(s) with comments: Elevation of Decompressed Skull Fractture (Right) - Elevation of Right Fronto-Temporal Skull Fracture  Patient Location: PACU  Anesthesia Type:General  Level of Consciousness: awake  Airway and Oxygen Therapy: Patient Spontanous Breathing  Post-op Pain: mild  Post-op Assessment: Post-op Vital signs reviewed  Post-op Vital Signs: stable  Complications: No apparent anesthesia complications

## 2012-09-07 NOTE — ED Notes (Signed)
Frank Scott, Trauma PA at the bedside with patient and family

## 2012-09-07 NOTE — Transfer of Care (Signed)
Immediate Anesthesia Transfer of Care Note  Patient: Frank Scott  Procedure(s) Performed: Procedure(s) with comments: Elevation of Decompressed Skull Fractture (Right) - Elevation of Right Fronto-Temporal Skull Fracture  Patient Location: PACU  Anesthesia Type:General  Level of Consciousness: awake, patient cooperative and lethargic  Airway & Oxygen Therapy: Patient Spontanous Breathing and Patient connected to face mask oxygen  Post-op Assessment: Report given to PACU RN, Post -op Vital signs reviewed and stable and Patient moving all extremities X 4  Post vital signs: Reviewed and stable  Complications: No apparent anesthesia complications

## 2012-09-07 NOTE — Progress Notes (Signed)
Performed by D. Daye, RN as reported to self.

## 2012-09-07 NOTE — Progress Notes (Signed)
Chaplain responded to Trauma B and provided ministry of presence and emotional support for pt's wife.  Will continue to follow as needed.  Rutherford Nail Chaplain

## 2012-09-07 NOTE — ED Provider Notes (Signed)
2:20 PM BP 149/87  Pulse 71  Temp(Src) 97.9 F (36.6 C)  Resp 20  SpO2 100%  LACERATION REPAIR Performed by: Arthor Captain Authorized by: Arthor Captain Consent: Verbal consent obtained. Risks and benefits: risks, benefits and alternatives were discussed Consent given by: patient Patient identity confirmed: provided demographic data Prepped and Draped in normal sterile fashion Wound explored  Laceration Location: forehead  Laceration Length: 6 cm  No Foreign Bodies seen or palpated  Anesthesia: local infiltration  Local anesthetic: lidocaine 1% without epinephrine  Anesthetic total: 8 ml  Irrigation method: syringe Amount of cleaning: standard  Skin closure: 4.0 prolene  Number of sutures: 6  Technique: si  Patient tolerance: Patient tolerated the procedure well with no immediate complications.  LACERATION REPAIR Performed by: Arthor Captain Authorized by: Arthor Captain Consent: Verbal consent obtained. Risks and benefits: risks, benefits and alternatives were discussed Consent given by: patient Patient identity confirmed: provided demographic data Prepped and Draped in normal sterile fashion Wound explored  Laceration Location: right forearm  Laceration Length: 2 cm  No Foreign Bodies seen or palpated  Anesthesia: local infiltration  Local anesthetic: lidocaine 1 % w/o epinephrine  Anesthetic total: 2 ml  Irrigation method: syringe Amount of cleaning: standard  Skin closure: 4.0 prolene  Number of sutures: 1 Technique: horizontal mattress  Patient tolerance: Patient tolerated the procedure well with no immediate complications.    Arthor Captain, PA-C 09/07/12 1424

## 2012-09-07 NOTE — ED Notes (Signed)
Patient transported to CT 

## 2012-09-07 NOTE — ED Notes (Signed)
Explained to Patient and family members we need a urine sample.  Patient stated he was not able to urinate at this time.

## 2012-09-07 NOTE — ED Provider Notes (Signed)
History     CSN: [id number]  Arrival date & time 09/07/12  1109   First MD Initiated Contact with Patient 09/07/12 1129      Chief Complaint  Patient presents with  . 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 The scaffolding broke and the patient fell to the ground. Witnesses report that he had loss of consciousness and first responders report the patient initially was confused. When EMS arrived, the patient was alert and oriented x3. He had a laceration to the right 4 head which has been bandaged. Patient is brought on spine board and c-collar. No medications were given prior to arrival but IV was started and patient given supplemental oxygen. He complains of pain to his right rib cage and flank area. He also has some lacerations and abrasions to his right forearm, both lower extremities. No complaint of neck or back pain. Patient reports no significant past medical history, does not take significant medications. He denies drug allergies. He is unsure of his last tetanus shot. He reports currently he is at about a 6/10 pain.  The history is provided by the patient and the EMS personnel.    History reviewed. No pertinent past medical history.  Past Surgical History  Procedure Laterality Date  . Appendectomy      History reviewed. No pertinent family history.  History  Substance Use Topics  . Smoking status: Never Smoker   . Smokeless tobacco: Not on file  . Alcohol Use: Yes     Comment: rarely      Review of Systems  Constitutional: Negative for fever and chills.  HENT: Negative for neck pain and neck stiffness.   Respiratory: Negative for shortness of breath.   Cardiovascular: Positive for chest pain.  Gastrointestinal: Positive for abdominal pain. Negative for nausea and vomiting.  Genitourinary:  Positive for flank pain.  Musculoskeletal: Positive for arthralgias.  Skin: Positive for wound.  Neurological: Positive for syncope and headaches.  All other systems reviewed and are negative.    Allergies  Review of patient's allergies indicates no known allergies.  Home Medications  No current outpatient prescriptions on file.  BP 159/85  Pulse 72  Temp(Src) 97.9 F (36.6 C)  Resp 17  SpO2 98%  Physical Exam  Nursing note and vitals reviewed. Constitutional: He is oriented to person, place, and time. He appears well-developed and well-nourished.  HENT:  Head: Normocephalic.    Right Ear: Hearing, tympanic membrane and external ear normal.  Left Ear: Hearing, tympanic membrane and external ear normal.  Mouth/Throat: Uvula is midline, oropharynx is clear and moist and mucous membranes are normal.  5.5 cm laceration of the right forehead, currently oozing blood. Crepitus present  Eyes: EOM are normal. Pupils are equal, round, and reactive to light.  Neck: Trachea normal and normal range of motion. Neck supple. No spinous process tenderness and no muscular tenderness present.  Cardiovascular: Normal rate and regular rhythm.   Pulmonary/Chest: Effort normal. No respiratory distress. He has no wheezes. He has no rales.  Abdominal: Soft. He exhibits no distension. There is tenderness. There is no rebound and no guarding.  Musculoskeletal: Normal range of motion.       Thoracic back: He exhibits no tenderness and no bony tenderness.       Lumbar back: He exhibits no bony tenderness, no spasm and normal pulse.  Back:       Arms:      Legs: Neurological: He is alert and oriented to person, place, and time. He has normal strength. He displays no atrophy. No cranial nerve deficit or sensory deficit. He exhibits normal muscle tone. Coordination normal. GCS eye subscore is 4. GCS verbal subscore is 5. GCS motor subscore is 6.  Skin: Skin is warm and dry. He is not diaphoretic.      ED Course  Procedures (including critical care time)  CRITICAL CARE Performed by: Lear Ng.   Total critical care time: 30 min  Critical care time was exclusive of separately billable procedures and treating other patients.  Critical care was necessary to treat or prevent imminent or life-threatening deterioration.  Critical care was time spent personally by me on the following activities: development of treatment plan with patient and/or surrogate as well as nursing, discussions with consultants, evaluation of patient's response to treatment, examination of patient, obtaining history from patient or surrogate, ordering and performing treatments and interventions, ordering and review of laboratory studies, ordering and review of radiographic studies, pulse oximetry and re-evaluation of patient's condition.   Labs Reviewed  CBC WITH DIFFERENTIAL - Abnormal; Notable for the following:    Lymphocytes Relative 48 (*)    Lymphs Abs 4.4 (*)    All other components within normal limits  COMPREHENSIVE METABOLIC PANEL - Abnormal; Notable for the following:    Glucose, Bld 143 (*)    AST 65 (*)    GFR calc non Af Amer 77 (*)    GFR calc Af Amer 89 (*)    All other components within normal limits  APTT  PROTIME-INR  URINALYSIS, ROUTINE W REFLEX MICROSCOPIC  SAMPLE TO BLOOD BANK   Ct Head Wo Contrast  09/07/2012  *RADIOLOGY REPORT*  Clinical Data:  Status post fall.  CT HEAD WITHOUT CONTRAST CT CERVICAL SPINE WITHOUT CONTRAST  Technique:  Multidetector CT imaging of the head and cervical spine was performed following the standard protocol without intravenous contrast.  Multiplanar CT image reconstructions of the cervical spine were also generated.  Comparison:   None  CT HEAD  Findings: The patient has a comminuted and depressed right frontal bone fracture.  Depression is estimated at approximately 1 cm. Associated pneumocephalus is noted.  There is underlying frontal lobe contusion  with the largest area involved measuring approximately 3 x 1.5 cm.  Subarachnoid blood is also seen about the site of the fracture.  No other hemorrhage is identified. There is no midline shift or hydrocephalus.  The calvarium is otherwise intact.  IMPRESSION:  Depressed right frontal bone fracture with associated parenchymal contusion and small amount of subarachnoid blood.  CT CERVICAL SPINE  Findings: No fracture or subluxation of the cervical spine is identified.  There is some loss of disc space height and endplate spurring at C5-6.  No epidural hematoma is visualized.  IMPRESSION: No acute finding.   Original Report Authenticated By: Holley Dexter, M.D.    Ct Chest W Contrast  09/07/2012  *RADIOLOGY REPORT*  Clinical Data:  Fall from approximately 30 feet, right flank/chest pain  CT CHEST, ABDOMEN AND PELVIS WITH CONTRAST  Technique:  Multidetector CT imaging of the chest, abdomen and pelvis was performed following the standard protocol during bolus administration of intravenous contrast.  Contrast: OMNIPAQUE IOHEXOL 300 MG/ML  SOLN  Comparison:  None.  CT CHEST  Findings:  No evidence of traumatic aortic injury.  No mediastinal hematoma.  Patchy opacities in  the posterior upper and lower lobes, favored to reflect dependent atelectasis, less likely aspiration.  Trace right pleural effusion.  No pneumothorax.  Visualized thyroid is unremarkable.  The heart is top normal in size.  No pericardial effusion.  No suspicious mediastinal or axillary lymphadenopathy.  Nondisplaced fractures of the anterolateral right 4th rib (series 3/image 30) and the lateral 5th-9th ribs.  IMPRESSION: Nondisplaced fractures of the right 4th-9th ribs.  Trace right pleural effusion.  Patchy opacities in the posterior upper and lower lobes, favored to reflect a dependent atelectasis, less likely aspiration.  CT ABDOMEN AND PELVIS  Findings:  Liver, spleen, and adrenal glands are within normal limits.  Calcification in the  pancreatic head (series 3/image 64), possibly sequela of prior/chronic pancreatitis.  Gallbladder is unremarkable.  No intrahepatic or extrahepatic ductal dilatation.  Kidneys are within normal limits.  No hydronephrosis.  No evidence of bowel obstruction.  No abdominopelvic ascites.  No hemoperitoneum.  No free air.  Atherosclerotic calcifications of the abdominal aorta and branch vessels. Intraluminal calcification within the left femoral vein (series 3/image 125 and 135) with associated left lower abdominopelvic collaterals (series 3/image 120), favored to reflect sequela of chronic deep venous thrombosis with collateralization. The bilateral iliac venous system is narrowed but symmetric.  No suspicious abdominopelvic lymphadenopathy.  Prostate is unremarkable.  Bladder is within normal limits.  Mild degenerative changes of the lumbar spine.  No fracture is seen.  IMPRESSION: No evidence of traumatic injury to the abdomen/pelvis.  Suspected sequela of chronic left femoral DVT with collateralization.   Original Report Authenticated By: Charline Bills, M.D.    Ct Cervical Spine Wo Contrast  09/07/2012  *RADIOLOGY REPORT*  Clinical Data:  Status post fall.  CT HEAD WITHOUT CONTRAST CT CERVICAL SPINE WITHOUT CONTRAST  Technique:  Multidetector CT imaging of the head and cervical spine was performed following the standard protocol without intravenous contrast.  Multiplanar CT image reconstructions of the cervical spine were also generated.  Comparison:   None  CT HEAD  Findings: The patient has a comminuted and depressed right frontal bone fracture.  Depression is estimated at approximately 1 cm. Associated pneumocephalus is noted.  There is underlying frontal lobe contusion with the largest area involved measuring approximately 3 x 1.5 cm.  Subarachnoid blood is also seen about the site of the fracture.  No other hemorrhage is identified. There is no midline shift or hydrocephalus.  The calvarium is otherwise  intact.  IMPRESSION:  Depressed right frontal bone fracture with associated parenchymal contusion and small amount of subarachnoid blood.  CT CERVICAL SPINE  Findings: No fracture or subluxation of the cervical spine is identified.  There is some loss of disc space height and endplate spurring at C5-6.  No epidural hematoma is visualized.  IMPRESSION: No acute finding.   Original Report Authenticated By: Holley Dexter, M.D.    Ct Abdomen Pelvis W Contrast  09/07/2012  *RADIOLOGY REPORT*  Clinical Data:  Fall from approximately 30 feet, right flank/chest pain  CT CHEST, ABDOMEN AND PELVIS WITH CONTRAST  Technique:  Multidetector CT imaging of the chest, abdomen and pelvis was performed following the standard protocol during bolus administration of intravenous contrast.  Contrast: OMNIPAQUE IOHEXOL 300 MG/ML  SOLN  Comparison:  None.  CT CHEST  Findings:  No evidence of traumatic aortic injury.  No mediastinal hematoma.  Patchy opacities in the posterior upper and lower lobes, favored to reflect dependent atelectasis, less likely aspiration.  Trace right pleural effusion.  No pneumothorax.  Visualized thyroid is unremarkable.  The heart is top normal in size.  No pericardial effusion.  No suspicious mediastinal or axillary lymphadenopathy.  Nondisplaced fractures of the anterolateral right 4th rib (series 3/image 30) and the lateral 5th-9th ribs.  IMPRESSION: Nondisplaced fractures of the right 4th-9th ribs.  Trace right pleural effusion.  Patchy opacities in the posterior upper and lower lobes, favored to reflect a dependent atelectasis, less likely aspiration.  CT ABDOMEN AND PELVIS  Findings:  Liver, spleen, and adrenal glands are within normal limits.  Calcification in the pancreatic head (series 3/image 64), possibly sequela of prior/chronic pancreatitis.  Gallbladder is unremarkable.  No intrahepatic or extrahepatic ductal dilatation.  Kidneys are within normal limits.  No hydronephrosis.  No  evidence of bowel obstruction.  No abdominopelvic ascites.  No hemoperitoneum.  No free air.  Atherosclerotic calcifications of the abdominal aorta and branch vessels. Intraluminal calcification within the left femoral vein (series 3/image 125 and 135) with associated left lower abdominopelvic collaterals (series 3/image 120), favored to reflect sequela of chronic deep venous thrombosis with collateralization. The bilateral iliac venous system is narrowed but symmetric.  No suspicious abdominopelvic lymphadenopathy.  Prostate is unremarkable.  Bladder is within normal limits.  Mild degenerative changes of the lumbar spine.  No fracture is seen.  IMPRESSION: No evidence of traumatic injury to the abdomen/pelvis.  Suspected sequela of chronic left femoral DVT with collateralization.   Original Report Authenticated By: Charline Bills, M.D.    Dg Chest Portable 1 View  09/07/2012  *RADIOLOGY REPORT*  Clinical Data: Status post fall.  PORTABLE CHEST - 1 VIEW  Comparison: None.  Findings: Bilateral upper lobe airspace opacities are identified. No pneumothorax is seen.  There is cardiomegaly.  The mediastinum appears widened which is likely due to portable technique and supine positioning.  IMPRESSION:  1.  Bilateral upper lobe opacities could reflect pulmonary contusions in the setting of trauma. 2.  Widened mediastinum is likely due to technical factors.   Original Report Authenticated By: Holley Dexter, M.D.      1. Subarachnoid hemorrhage following injury with open intracranial wound, initial encounter   2. Multiple rib fractures, right, closed, initial encounter   3. Fall from scaffold, initial encounter   4. Abrasion, multiple sites     Saturations 99% on supplemental nasal cannula oxygen which I interpret to be adequate.    3:12 PM Wound cleaned and sutured by [facility] with my availability.  Head CT shows depressed skull fracture with associated SAH indicating open fracture.  Spoke to Dr. Lindie Spruce  with trauma to see and admit.  Paged [facility] as well to obtain consultation.  IV rocephin ordered as well.  Pt's mentation remains alert, oriented times 3.    MDM  Level II trauma was called to to nature of fall. Patient also had loss of consciousness with transient confusion, likely clinically he has a concussion. Patient with a laceration of forehead which will require wound repair. Patient was initially hypertensive, I suspect due to stress and pain. Blood pressure is improving over time after analgesia was applied. We'll update tetanus. Will obtain portable chest x-ray initially. Given significant mechanism, will obtain CTs of head, C-spine, chest, abdomen and pelvis. Patient has abrasions and superficial lacerations of limbs which will require wound care but I do not suspect wound closure. Good range of motion of wrists, elbows, shoulder, hips, knees ankles. I do not think plain films are indicated of extremities at this time. We'll continue to monitor.  Gavin Pound. Ghim, MD 09/07/12 1513

## 2012-09-07 NOTE — Consult Note (Signed)
Reason for Consult:Depressed skull fracture Referring Physician: Trauma MD  Frank Scott is an 62 y.o. male.  HPI: 62 year old male fell from approximately 20 feet from scaffolding and brought to [facility] where frond to have depressed skull fracture as well as rib fractures. Neurosurgical consult requested.  History reviewed. No pertinent past medical history.  Past Surgical History  Procedure Laterality Date  . Appendectomy      History reviewed. No pertinent family history.  Social History:  reports that he has never smoked. He does not have any smokeless tobacco history on file. He reports that  drinks alcohol. He reports that he does not use illicit drugs.  Allergies: No Known Allergies  Medications: I have reviewed the patient's current medications.  Results for orders placed during the hospital encounter of 09/07/12 (from the past 48 hour(s))  SAMPLE TO BLOOD BANK     Status: None   Collection Time    09/07/12 11:45 AM      Result Value Range   Blood Bank Specimen SAMPLE AVAILABLE FOR TESTING     Sample Expiration 09/08/2012    CBC WITH DIFFERENTIAL     Status: Abnormal   Collection Time    09/07/12 11:52 AM      Result Value Range   WBC 9.1  4.0 - 10.5 K/uL   RBC 5.18  4.22 - 5.81 MIL/uL   Hemoglobin 14.9  13.0 - 17.0 g/dL   HCT 21.3  08.6 - 57.8 %   MCV 81.3  78.0 - 100.0 fL   MCH 28.8  26.0 - 34.0 pg   MCHC 35.4  30.0 - 36.0 g/dL   RDW 46.9  62.9 - 52.8 %   Platelets 199  150 - 400 K/uL   Neutrophils Relative 44  43 - 77 %   Neutro Abs 4.0  1.7 - 7.7 K/uL   Lymphocytes Relative 48 (*) 12 - 46 %   Lymphs Abs 4.4 (*) 0.7 - 4.0 K/uL   Monocytes Relative 5  3 - 12 %   Monocytes Absolute 0.5  0.1 - 1.0 K/uL   Eosinophils Relative 3  0 - 5 %   Eosinophils Absolute 0.2  0.0 - 0.7 K/uL   Basophils Relative 1  0 - 1 %   Basophils Absolute 0.1  0.0 - 0.1 K/uL  COMPREHENSIVE METABOLIC PANEL     Status: Abnormal   Collection Time    09/07/12 11:52 AM      Result  Value Range   Sodium 137  135 - 145 mEq/L   Potassium 3.9  3.5 - 5.1 mEq/L   Chloride 102  96 - 112 mEq/L   CO2 22  19 - 32 mEq/L   Glucose, Bld 143 (*) 70 - 99 mg/dL   BUN 15  6 - 23 mg/dL   Creatinine, Ser 4.13  0.50 - 1.35 mg/dL   Calcium 8.7  8.4 - 24.4 mg/dL   Total Protein 7.5  6.0 - 8.3 g/dL   Albumin 3.8  3.5 - 5.2 g/dL   AST 65 (*) 0 - 37 U/L   ALT 37  0 - 53 U/L   Alkaline Phosphatase 102  39 - 117 U/L   Total Bilirubin 0.6  0.3 - 1.2 mg/dL   GFR calc non Af Amer 77 (*) >90 mL/min   GFR calc Af Amer 89 (*) >90 mL/min   Comment:            The eGFR has been  calculated     using the CKD EPI equation.     This calculation has not been     validated in all clinical     situations.     eGFR's persistently     <90 mL/min signify     possible Chronic Kidney Disease.  APTT     Status: None   Collection Time    09/07/12 11:52 AM      Result Value Range   aPTT 29  24 - 37 seconds  PROTIME-INR     Status: None   Collection Time    09/07/12 11:52 AM      Result Value Range   Prothrombin Time 13.4  11.6 - 15.2 seconds   INR 1.03  0.00 - 1.49  URINALYSIS, ROUTINE W REFLEX MICROSCOPIC     Status: Abnormal   Collection Time    09/07/12  3:16 PM      Result Value Range   Color, Urine YELLOW  YELLOW   APPearance CLEAR  CLEAR   Specific Gravity, Urine 1.034 (*) 1.005 - 1.030   pH 5.5  5.0 - 8.0   Glucose, UA NEGATIVE  NEGATIVE mg/dL   Hgb urine dipstick NEGATIVE  NEGATIVE   Bilirubin Urine NEGATIVE  NEGATIVE   Ketones, ur 15 (*) NEGATIVE mg/dL   Protein, ur NEGATIVE  NEGATIVE mg/dL   Urobilinogen, UA 1.0  0.0 - 1.0 mg/dL   Nitrite NEGATIVE  NEGATIVE   Leukocytes, UA NEGATIVE  NEGATIVE   Comment: MICROSCOPIC NOT DONE ON URINES WITH NEGATIVE PROTEIN, BLOOD, LEUKOCYTES, NITRITE, OR GLUCOSE <1000 mg/dL.    Ct Head Wo Contrast  09/07/2012  *RADIOLOGY REPORT*  Clinical Data:  Status post fall.  CT HEAD WITHOUT CONTRAST CT CERVICAL SPINE WITHOUT CONTRAST  Technique:   Multidetector CT imaging of the head and cervical spine was performed following the standard protocol without intravenous contrast.  Multiplanar CT image reconstructions of the cervical spine were also generated.  Comparison:   None  CT HEAD  Findings: The patient has a comminuted and depressed right frontal bone fracture.  Depression is estimated at approximately 1 cm. Associated pneumocephalus is noted.  There is underlying frontal lobe contusion with the largest area involved measuring approximately 3 x 1.5 cm.  Subarachnoid blood is also seen about the site of the fracture.  No other hemorrhage is identified. There is no midline shift or hydrocephalus.  The calvarium is otherwise intact.  IMPRESSION:  Depressed right frontal bone fracture with associated parenchymal contusion and small amount of subarachnoid blood.  CT CERVICAL SPINE  Findings: No fracture or subluxation of the cervical spine is identified.  There is some loss of disc space height and endplate spurring at C5-6.  No epidural hematoma is visualized.  IMPRESSION: No acute finding.   Original Report Authenticated By: Holley Dexter, M.D.    Ct Chest W Contrast  09/07/2012  *RADIOLOGY REPORT*  Clinical Data:  Fall from approximately 30 feet, right flank/chest pain  CT CHEST, ABDOMEN AND PELVIS WITH CONTRAST  Technique:  Multidetector CT imaging of the chest, abdomen and pelvis was performed following the standard protocol during bolus administration of intravenous contrast.  Contrast: OMNIPAQUE IOHEXOL 300 MG/ML  SOLN  Comparison:  None.  CT CHEST  Findings:  No evidence of traumatic aortic injury.  No mediastinal hematoma.  Patchy opacities in the posterior upper and lower lobes, favored to reflect dependent atelectasis, less likely aspiration.  Trace right pleural effusion.  No pneumothorax.  Visualized  thyroid is unremarkable.  The heart is top normal in size.  No pericardial effusion.  No suspicious mediastinal or axillary  lymphadenopathy.  Nondisplaced fractures of the anterolateral right 4th rib (series 3/image 30) and the lateral 5th-9th ribs.  IMPRESSION: Nondisplaced fractures of the right 4th-9th ribs.  Trace right pleural effusion.  Patchy opacities in the posterior upper and lower lobes, favored to reflect a dependent atelectasis, less likely aspiration.  CT ABDOMEN AND PELVIS  Findings:  Liver, spleen, and adrenal glands are within normal limits.  Calcification in the pancreatic head (series 3/image 64), possibly sequela of prior/chronic pancreatitis.  Gallbladder is unremarkable.  No intrahepatic or extrahepatic ductal dilatation.  Kidneys are within normal limits.  No hydronephrosis.  No evidence of bowel obstruction.  No abdominopelvic ascites.  No hemoperitoneum.  No free air.  Atherosclerotic calcifications of the abdominal aorta and branch vessels. Intraluminal calcification within the left femoral vein (series 3/image 125 and 135) with associated left lower abdominopelvic collaterals (series 3/image 120), favored to reflect sequela of chronic deep venous thrombosis with collateralization. The bilateral iliac venous system is narrowed but symmetric.  No suspicious abdominopelvic lymphadenopathy.  Prostate is unremarkable.  Bladder is within normal limits.  Mild degenerative changes of the lumbar spine.  No fracture is seen.  IMPRESSION: No evidence of traumatic injury to the abdomen/pelvis.  Suspected sequela of chronic left femoral DVT with collateralization.   Original Report Authenticated By: Charline Bills, M.D.    Ct Cervical Spine Wo Contrast  09/07/2012  *RADIOLOGY REPORT*  Clinical Data:  Status post fall.  CT HEAD WITHOUT CONTRAST CT CERVICAL SPINE WITHOUT CONTRAST  Technique:  Multidetector CT imaging of the head and cervical spine was performed following the standard protocol without intravenous contrast.  Multiplanar CT image reconstructions of the cervical spine were also generated.  Comparison:   None   CT HEAD  Findings: The patient has a comminuted and depressed right frontal bone fracture.  Depression is estimated at approximately 1 cm. Associated pneumocephalus is noted.  There is underlying frontal lobe contusion with the largest area involved measuring approximately 3 x 1.5 cm.  Subarachnoid blood is also seen about the site of the fracture.  No other hemorrhage is identified. There is no midline shift or hydrocephalus.  The calvarium is otherwise intact.  IMPRESSION:  Depressed right frontal bone fracture with associated parenchymal contusion and small amount of subarachnoid blood.  CT CERVICAL SPINE  Findings: No fracture or subluxation of the cervical spine is identified.  There is some loss of disc space height and endplate spurring at C5-6.  No epidural hematoma is visualized.  IMPRESSION: No acute finding.   Original Report Authenticated By: Holley Dexter, M.D.    Ct Abdomen Pelvis W Contrast  09/07/2012  *RADIOLOGY REPORT*  Clinical Data:  Fall from approximately 30 feet, right flank/chest pain  CT CHEST, ABDOMEN AND PELVIS WITH CONTRAST  Technique:  Multidetector CT imaging of the chest, abdomen and pelvis was performed following the standard protocol during bolus administration of intravenous contrast.  Contrast: OMNIPAQUE IOHEXOL 300 MG/ML  SOLN  Comparison:  None.  CT CHEST  Findings:  No evidence of traumatic aortic injury.  No mediastinal hematoma.  Patchy opacities in the posterior upper and lower lobes, favored to reflect dependent atelectasis, less likely aspiration.  Trace right pleural effusion.  No pneumothorax.  Visualized thyroid is unremarkable.  The heart is top normal in size.  No pericardial effusion.  No suspicious mediastinal or axillary lymphadenopathy.  Nondisplaced fractures of the anterolateral right 4th rib (series 3/image 30) and the lateral 5th-9th ribs.  IMPRESSION: Nondisplaced fractures of the right 4th-9th ribs.  Trace right pleural effusion.  Patchy  opacities in the posterior upper and lower lobes, favored to reflect a dependent atelectasis, less likely aspiration.  CT ABDOMEN AND PELVIS  Findings:  Liver, spleen, and adrenal glands are within normal limits.  Calcification in the pancreatic head (series 3/image 64), possibly sequela of prior/chronic pancreatitis.  Gallbladder is unremarkable.  No intrahepatic or extrahepatic ductal dilatation.  Kidneys are within normal limits.  No hydronephrosis.  No evidence of bowel obstruction.  No abdominopelvic ascites.  No hemoperitoneum.  No free air.  Atherosclerotic calcifications of the abdominal aorta and branch vessels. Intraluminal calcification within the left femoral vein (series 3/image 125 and 135) with associated left lower abdominopelvic collaterals (series 3/image 120), favored to reflect sequela of chronic deep venous thrombosis with collateralization. The bilateral iliac venous system is narrowed but symmetric.  No suspicious abdominopelvic lymphadenopathy.  Prostate is unremarkable.  Bladder is within normal limits.  Mild degenerative changes of the lumbar spine.  No fracture is seen.  IMPRESSION: No evidence of traumatic injury to the abdomen/pelvis.  Suspected sequela of chronic left femoral DVT with collateralization.   Original Report Authenticated By: Charline Bills, M.D.    Dg Chest Portable 1 View  09/07/2012  *RADIOLOGY REPORT*  Clinical Data: Status post fall.  PORTABLE CHEST - 1 VIEW  Comparison: None.  Findings: Bilateral upper lobe airspace opacities are identified. No pneumothorax is seen.  There is cardiomegaly.  The mediastinum appears widened which is likely due to portable technique and supine positioning.  IMPRESSION:  1.  Bilateral upper lobe opacities could reflect pulmonary contusions in the setting of trauma. 2.  Widened mediastinum is likely due to technical factors.   Original Report Authenticated By: Holley Dexter, M.D.     Review of systems not obtained due to patient  factors. Blood pressure 135/91, pulse 70, temperature 97.9 F (36.6 C), resp. rate 16, SpO2 96.00%. Awake, alert, conversant. No focal weakness. Verbal fluent and appropriate.  Assessment/Plan: CT reviewed and shows markedly depressed skull fracture in right frontal region with a number of comminuted fragments. Small amount of underlying contusion. This was an open fracture, and should be elevated. Have talked to aptient and family, and they agree with plan. Will proceed with surgery asap.  Reinaldo Meeker, MD 09/07/2012, 4:24 PM

## 2012-09-07 NOTE — ED Provider Notes (Signed)
Was available for procedure   Gavin Pound. Ghim, MD 09/07/12 1425

## 2012-09-08 ENCOUNTER — Inpatient Hospital Stay (HOSPITAL_COMMUNITY): Payer: Worker's Compensation

## 2012-09-08 LAB — CBC
MCH: 28.3 pg (ref 26.0–34.0)
Platelets: 180 10*3/uL (ref 150–400)
RBC: 4.8 MIL/uL (ref 4.22–5.81)

## 2012-09-08 LAB — BASIC METABOLIC PANEL
CO2: 23 mEq/L (ref 19–32)
Calcium: 8.5 mg/dL (ref 8.4–10.5)
GFR calc non Af Amer: 89 mL/min — ABNORMAL LOW (ref >90)
Sodium: 135 mEq/L (ref 135–145)

## 2012-09-08 MED ORDER — NAPROXEN 500 MG PO TABS
500.0000 mg | ORAL_TABLET | Freq: Two times a day (BID) | ORAL | Status: DC
  Administered 2012-09-08 – 2012-09-14 (×11): 500 mg via ORAL
  Filled 2012-09-08 (×15): qty 1

## 2012-09-08 MED ORDER — HYDROMORPHONE HCL PF 1 MG/ML IJ SOLN
0.5000 mg | INTRAMUSCULAR | Status: DC | PRN
  Administered 2012-09-10: 0.5 mg via INTRAVENOUS
  Filled 2012-09-08: qty 1

## 2012-09-08 MED ORDER — OXYCODONE HCL 5 MG PO TABS
5.0000 mg | ORAL_TABLET | ORAL | Status: DC | PRN
  Administered 2012-09-08: 10 mg via ORAL
  Administered 2012-09-08 (×2): 5 mg via ORAL
  Filled 2012-09-08: qty 2
  Filled 2012-09-08 (×2): qty 1

## 2012-09-08 MED ORDER — CEFTRIAXONE SODIUM 1 G IJ SOLR
1.0000 g | INTRAMUSCULAR | Status: AC
  Administered 2012-09-08 – 2012-09-09 (×2): 1 g via INTRAVENOUS
  Filled 2012-09-08 (×2): qty 10

## 2012-09-08 NOTE — Evaluation (Signed)
Speech Language Pathology Evaluation Patient Details Name: Frank Scott MRN: [id number] DOB: 09-29-1950 Today's Date: 09/08/2012 Time: 1130-1200 SLP Time Calculation (min): 30 min  Problem List:  Patient Active Problem List  Diagnosis  . OBESITY  . ELEVATED BLOOD PRESSURE WITHOUT DIAGNOSIS OF HYPERTENSION  . Fall  . Open depressed fracture of skull  . Traumatic subarachnoid hemorrhage  . Traumatic intracerebral hemorrhage  . Multiple fractures of ribs of right side   Past Medical History: History reviewed. No pertinent past medical history. Past Surgical History:  Past Surgical History  Procedure Laterality Date  . Appendectomy     HPI:  62 year old male fell from approximately 20 feet from scaffolding and brought to [facility] where found to have depressed skull fracture as well as rib fractures. CT reviewed and shows markedly depressed skull fracture in right frontal region with a number of comminuted fragments. Small amount of underlying contusion. This was an open fracture, and should be elevated. S/p craniotomy.  Patient referred for Cognitive Linguistic Evaluation to assess current cognitive functioning following surgery.     Assessment / Plan / Recommendation Clinical Impression  Moderate cognitive deficits indicated in areas of short term memory, immediate and delayed recall, reasoning and problem solving.  Deficits present but cognitive skills functional in acute care setting.  Recommend futher Speech Language Pathology at next level of care to address cognitive deficits. Patient to benefit from [facility] consult. ST to sign off.     SLP Assessment  All further Speech Lanaguage Pathology  needs can be addressed in the next venue of care    Follow Up Recommendations  Inpatient Rehab           SLP Evaluation Prior Functioning  Cognitive/Linguistic Baseline: Within functional limits Type of Home: House Lives With: Spouse Available Help at Discharge: Family;Available  PRN/intermittently Education: Highschool education Vocation: Full time employment   Cognition  Overall Cognitive Status: Impaired Arousal/Alertness: Lethargic Orientation Level: Oriented X4 Attention: Focused Focused Attention: Impaired Focused Attention Impairment: Verbal complex;Functional basic;Functional complex Memory: Impaired Memory Impairment: Retrieval deficit;Decreased recall of new information;Storage deficit Awareness: Appears intact Problem Solving: Impaired Problem Solving Impairment: Verbal complex;Functional basic;Functional complex Executive Function: Initiating;Self Correcting;Self Monitoring Initiating: Impaired Initiating Impairment: Verbal complex;Functional basic;Functional complex Self Monitoring: Impaired Self Monitoring Impairment: Verbal complex;Functional basic;Functional complex Self Correcting: Impaired Self Correcting Impairment: Verbal complex;Functional basic;Functional complex    Comprehension  Auditory Comprehension Overall Auditory Comprehension: Appears within functional limits for tasks assessed    Expression Expression Primary Mode of Expression: Verbal Verbal Expression Overall Verbal Expression: Appears within functional limits for tasks assessed   Oral / Motor Oral Motor/Sensory Function Overall Oral Motor/Sensory Function: Appears within functional limits for tasks assessed Motor Speech Overall Motor Speech: Appears within functional limits for tasks assessed   GO    Moreen Fowler MS, CCC-SLP [phone] [facility] 09/08/2012, 2:39 PM

## 2012-09-08 NOTE — Progress Notes (Signed)
I have seen and examined the pt and agree with PA-Jeffery's note. To floor Pt will likely need rehab consult per PT

## 2012-09-08 NOTE — Progress Notes (Signed)
Patient ID: Frank Scott, male   DOB: 1950/09/04, 62 y.o.   MRN: [id number]   LOS: 1 day   Subjective: No unexpected c/o. A&A.  Objective: Vital signs in last 24 hours: Temp:  [97 F (36.1 C)-98.7 F (37.1 C)] 98.7 F (37.1 C) (03/15 0356) Pulse Rate:  [58-76] 62 (03/15 0700) Resp:  [11-22] 21 (03/15 0827) BP: (109-190)/(73-106) 160/80 mmHg (03/15 0700) SpO2:  [92 %-100 %] 96 % (03/15 0827) Weight:  [220 lb (99.791 kg)-287 lb 14.7 oz (130.6 kg)] 287 lb 14.7 oz (130.6 kg) (03/14 1935)     Laboratory  CBC  Recent Labs  09/07/12 1152 09/08/12 0540  WBC 9.1 8.9  HGB 14.9 13.6  HCT 42.1 39.3  PLT 199 180   BMET  Recent Labs  09/07/12 1152 09/08/12 0540  NA 137 135  K 3.9 4.6  CL 102 101  CO2 22 23  GLUCOSE 143* 143*  BUN 15 16  CREATININE 1.03 0.93  CALCIUM 8.7 8.5     Physical Exam General appearance: alert and no distress Resp: clear to auscultation bilaterally Cardio: regular rate and rhythm GI: normal findings: bowel sounds normal and soft, non-tender Neuro: GCS 15   Assessment/Plan: Fall Open depressed skull fx s/p elevation TBI w/SAH, ICC -- Expected evolution on CT Multiple right rib fxs -- Pain control and pulmonary toilet FEN -- D/C PCA, orals for pain + NSAID VTE -- SCD's Dispo -- to floor, awaiting PT/OT/ST consults   Freeman Caldron, PA-C Pager: [phone] General Trauma PA Pager: [phone]   09/08/2012

## 2012-09-08 NOTE — Plan of Care (Signed)
Problem: Consults Goal: Diagnosis - Craniotomy Skull Fx

## 2012-09-08 NOTE — Evaluation (Signed)
Physical Therapy Evaluation Patient Details Name: Frank Scott MRN: [id number] DOB: 06-Dec-1950 Today's Date: 09/08/2012 Time: 7829-5621 PT Time Calculation (min): 30 min  PT Assessment / Plan / Recommendation Clinical Impression  Pt adm after fall from scaffolding.  Pt with skull fx and rt rib fx's.  Pt underwent craniotomy.  Needs skilled PT to maximize I and safety so pt can eventually return home with wife.  Feel pt can benefit from CIR before return home.    PT Assessment  Patient needs continued PT services    Follow Up Recommendations  CIR    Does the patient have the potential to tolerate intense rehabilitation      Barriers to Discharge        Equipment Recommendations  Rolling walker with 5" wheels    Recommendations for Other Services Rehab consult   Frequency Min 5X/week    Precautions / Restrictions Precautions Precautions: Fall Restrictions Weight Bearing Restrictions: No   Pertinent Vitals/Pain Rib pain 10/10 with mobility. SaO2 88% on RA.      Mobility  Bed Mobility Bed Mobility: Supine to Sit;Sitting - Scoot to Edge of Bed Supine to Sit: 1: +2 Total assist;HOB elevated Supine to Sit: Patient Percentage: 60% Sitting - Scoot to Edge of Bed: 4: Min assist Details for Bed Mobility Assistance: Assist to move legs off EOB and to bring trunk up. Transfers Transfers: Sit to Stand;Stand to Sit Sit to Stand: 1: +2 Total assist;From elevated surface;With upper extremity assist;From bed Sit to Stand: Patient Percentage: 60% Stand to Sit: 1: +2 Total assist;With upper extremity assist;With armrests;To chair/3-in-1 Stand to Sit: Patient Percentage: 70% Details for Transfer Assistance: Assist to bring hips up and to control descent. Ambulation/Gait Ambulation/Gait Assistance: 1: +2 Total assist Ambulation/Gait: Patient Percentage: 70% Ambulation Distance (Feet): 5 Feet Assistive device: Rolling walker Ambulation/Gait Assistance Details: verbal cues to keep  eyes open and stand more erect. Gait Pattern: Step-to pattern;Decreased step length - right;Decreased step length - left;Shuffle;Trunk flexed Gait velocity: decr    Exercises     PT Diagnosis: Difficulty walking;Generalized weakness;Acute pain  PT Problem List: Decreased strength;Decreased activity tolerance;Decreased balance;Decreased mobility;Decreased cognition;Decreased knowledge of use of DME;Decreased knowledge of precautions PT Treatment Interventions: DME instruction;Gait training;Patient/family education;Stair training;Functional mobility training;Therapeutic activities;Therapeutic exercise;Balance training   PT Goals Acute Rehab PT Goals PT Goal Formulation: With patient Time For Goal Achievement: 09/22/12 Potential to Achieve Goals: Good Pt will go Supine/Side to Sit: with supervision PT Goal: Supine/Side to Sit - Progress: Goal set today Pt will go Sit to Supine/Side: with supervision PT Goal: Sit to Supine/Side - Progress: Goal set today Pt will go Sit to Stand: with supervision PT Goal: Sit to Stand - Progress: Goal set today Pt will go Stand to Sit: with supervision PT Goal: Stand to Sit - Progress: Goal set today Pt will Ambulate: >150 feet;with supervision;with least restrictive assistive device PT Goal: Ambulate - Progress: Goal set today Pt will Go Up / Down Stairs: 1-2 stairs;with min assist;with least restrictive assistive device PT Goal: Up/Down Stairs - Progress: Goal set today  Visit Information  Last PT Received On: 09/08/12 Assistance Needed: +2    Subjective Data  Subjective: Pt states his head feels heavy. Patient Stated Goal: Return home   Prior Functioning  Home Living Lives With: Spouse Available Help at Discharge: Family Type of Home: House Home Access: Stairs to enter Secretary/administrator of Steps: 2 Entrance Stairs-Rails: Right Home Layout: One level Bathroom Shower/Tub: Engineer, manufacturing systems: Standard Home  Adaptive  Equipment: None Prior Function Level of Independence: Independent Able to Take Stairs?: Yes Driving: Yes Vocation: Full time employment Communication Communication: No difficulties Dominant Hand: Right    Cognition  Cognition Overall Cognitive Status: Impaired Area of Impairment: Attention;Awareness of deficits Arousal/Alertness: Lethargic Orientation Level: Disoriented to;Situation Behavior During Session: Lethargic Current Attention Level: Sustained Awareness of Deficits: Pt appeared unaware that he was lying in  a large amount of urine. Cognition - Other Comments: Slow to respond.  May also be related to pain meds. Easily arousable but quickly back to sleep.    Extremity/Trunk Assessment Right Lower Extremity Assessment RLE ROM/Strength/Tone: Deficits RLE ROM/Strength/Tone Deficits: grossly 4/5 Left Lower Extremity Assessment LLE ROM/Strength/Tone: Deficits LLE ROM/Strength/Tone Deficits: grossly 4/5   Balance Balance Balance Assessed: Yes Static Standing Balance Static Standing - Balance Support: Bilateral upper extremity supported Static Standing - Level of Assistance: 4: Min assist  End of Session PT - End of Session Activity Tolerance: Patient limited by pain;Patient limited by fatigue Patient left: in chair;with call bell/phone within reach Nurse Communication: Mobility status  GP     [facility] 09/08/2012, 10:09 AM  Skip Mayer PT [phone]

## 2012-09-08 NOTE — Progress Notes (Signed)
Patient ID: Frank Scott, male   DOB: [birth year], 62 y.o.   MRN: [id number] Subjective: Patient reports feeling good  Objective: Vital signs in last 24 hours: Temp:  [97 F (36.1 C)-98.7 F (37.1 C)] 98.7 F (37.1 C) (03/15 0356) Pulse Rate:  [58-76] 61 (03/15 0800) Resp:  [11-22] 21 (03/15 0827) BP: (109-190)/(73-106) 145/83 mmHg (03/15 0800) SpO2:  [92 %-100 %] 96 % (03/15 0827) Weight:  [99.791 kg (220 lb)-130.6 kg (287 lb 14.7 oz)] 130.6 kg (287 lb 14.7 oz) (03/14 1935)  Intake/Output from previous day: 03/14 0701 - 03/15 0700 In: 2618.3 [I.V.:2618.3] Out: 460 [Urine:450; Blood:10] Intake/Output this shift:    awake, alert; no focal deficit  Lab Results:  Recent Labs  09/07/12 1152 09/08/12 0540  WBC 9.1 8.9  HGB 14.9 13.6  HCT 42.1 39.3  PLT 199 180   BMET  Recent Labs  09/07/12 1152 09/08/12 0540  NA 137 135  K 3.9 4.6  CL 102 101  CO2 22 23  GLUCOSE 143* 143*  BUN 15 16  CREATININE 1.03 0.93  CALCIUM 8.7 8.5    Studies/Results: Ct Head Without Contrast  09/08/2012  *RADIOLOGY REPORT*  Clinical Data: Postoperative follow-up.  Fell from scaffolding.  No new complaints.  CT HEAD WITHOUT CONTRAST  Technique:  Contiguous axial images were obtained from the base of the skull through the vertex without contrast.  Comparison: 09/07/2012  Findings: Interval postoperative changes with plate and screw fixation of right frontal bone fractures.  There is been interval reduction of depressed fragments with minimal residual depression noted.  There is subcutaneous scalp hematoma and gas consistent with postoperative changes.  Skin clips are present.  Small residual subdural, intraparenchymal, and subarachnoid hematoma in the right frontal region with developing low attenuation change in the right frontal region consistent with usual evolution of hemorrhage and contusion.  No significant mass effect or midline shift.  Ventricles are not dilated.  No new areas of  hemorrhage. Retention cyst in the right maxillary antrum.  IMPRESSION: Interval postoperative changes in the right frontal region as described.  Involving subdural and subarachnoid and intraparenchymal hemorrhages and contusive changes in the right frontal lobe.  No new hemorrhage or significant mass effect.   Original Report Authenticated By: Burman Nieves, M.D.    Ct Head Wo Contrast  09/07/2012  *RADIOLOGY REPORT*  Clinical Data:  Status post fall.  CT HEAD WITHOUT CONTRAST CT CERVICAL SPINE WITHOUT CONTRAST  Technique:  Multidetector CT imaging of the head and cervical spine was performed following the standard protocol without intravenous contrast.  Multiplanar CT image reconstructions of the cervical spine were also generated.  Comparison:   None  CT HEAD  Findings: The patient has a comminuted and depressed right frontal bone fracture.  Depression is estimated at approximately 1 cm. Associated pneumocephalus is noted.  There is underlying frontal lobe contusion with the largest area involved measuring approximately 3 x 1.5 cm.  Subarachnoid blood is also seen about the site of the fracture.  No other hemorrhage is identified. There is no midline shift or hydrocephalus.  The calvarium is otherwise intact.  IMPRESSION:  Depressed right frontal bone fracture with associated parenchymal contusion and small amount of subarachnoid blood.  CT CERVICAL SPINE  Findings: No fracture or subluxation of the cervical spine is identified.  There is some loss of disc space height and endplate spurring at C5-6.  No epidural hematoma is visualized.  IMPRESSION: No acute finding.   Original Report Authenticated By:  Holley Dexter, M.D.    Ct Chest W Contrast  09/07/2012  *RADIOLOGY REPORT*  Clinical Data:  Fall from approximately 30 feet, right flank/chest pain  CT CHEST, ABDOMEN AND PELVIS WITH CONTRAST  Technique:  Multidetector CT imaging of the chest, abdomen and pelvis was performed following the standard  protocol during bolus administration of intravenous contrast.  Contrast: OMNIPAQUE IOHEXOL 300 MG/ML  SOLN  Comparison:  None.  CT CHEST  Findings:  No evidence of traumatic aortic injury.  No mediastinal hematoma.  Patchy opacities in the posterior upper and lower lobes, favored to reflect dependent atelectasis, less likely aspiration.  Trace right pleural effusion.  No pneumothorax.  Visualized thyroid is unremarkable.  The heart is top normal in size.  No pericardial effusion.  No suspicious mediastinal or axillary lymphadenopathy.  Nondisplaced fractures of the anterolateral right 4th rib (series 3/image 30) and the lateral 5th-9th ribs.  IMPRESSION: Nondisplaced fractures of the right 4th-9th ribs.  Trace right pleural effusion.  Patchy opacities in the posterior upper and lower lobes, favored to reflect a dependent atelectasis, less likely aspiration.  CT ABDOMEN AND PELVIS  Findings:  Liver, spleen, and adrenal glands are within normal limits.  Calcification in the pancreatic head (series 3/image 64), possibly sequela of prior/chronic pancreatitis.  Gallbladder is unremarkable.  No intrahepatic or extrahepatic ductal dilatation.  Kidneys are within normal limits.  No hydronephrosis.  No evidence of bowel obstruction.  No abdominopelvic ascites.  No hemoperitoneum.  No free air.  Atherosclerotic calcifications of the abdominal aorta and branch vessels. Intraluminal calcification within the left femoral vein (series 3/image 125 and 135) with associated left lower abdominopelvic collaterals (series 3/image 120), favored to reflect sequela of chronic deep venous thrombosis with collateralization. The bilateral iliac venous system is narrowed but symmetric.  No suspicious abdominopelvic lymphadenopathy.  Prostate is unremarkable.  Bladder is within normal limits.  Mild degenerative changes of the lumbar spine.  No fracture is seen.  IMPRESSION: No evidence of traumatic injury to the abdomen/pelvis.   Suspected sequela of chronic left femoral DVT with collateralization.   Original Report Authenticated By: Charline Bills, M.D.    Ct Cervical Spine Wo Contrast  09/07/2012  *RADIOLOGY REPORT*  Clinical Data:  Status post fall.  CT HEAD WITHOUT CONTRAST CT CERVICAL SPINE WITHOUT CONTRAST  Technique:  Multidetector CT imaging of the head and cervical spine was performed following the standard protocol without intravenous contrast.  Multiplanar CT image reconstructions of the cervical spine were also generated.  Comparison:   None  CT HEAD  Findings: The patient has a comminuted and depressed right frontal bone fracture.  Depression is estimated at approximately 1 cm. Associated pneumocephalus is noted.  There is underlying frontal lobe contusion with the largest area involved measuring approximately 3 x 1.5 cm.  Subarachnoid blood is also seen about the site of the fracture.  No other hemorrhage is identified. There is no midline shift or hydrocephalus.  The calvarium is otherwise intact.  IMPRESSION:  Depressed right frontal bone fracture with associated parenchymal contusion and small amount of subarachnoid blood.  CT CERVICAL SPINE  Findings: No fracture or subluxation of the cervical spine is identified.  There is some loss of disc space height and endplate spurring at C5-6.  No epidural hematoma is visualized.  IMPRESSION: No acute finding.   Original Report Authenticated By: Holley Dexter, M.D.    Ct Abdomen Pelvis W Contrast  09/07/2012  *RADIOLOGY REPORT*  Clinical Data:  Fall from approximately 30  feet, right flank/chest pain  CT CHEST, ABDOMEN AND PELVIS WITH CONTRAST  Technique:  Multidetector CT imaging of the chest, abdomen and pelvis was performed following the standard protocol during bolus administration of intravenous contrast.  Contrast: OMNIPAQUE IOHEXOL 300 MG/ML  SOLN  Comparison:  None.  CT CHEST  Findings:  No evidence of traumatic aortic injury.  No mediastinal hematoma.   Patchy opacities in the posterior upper and lower lobes, favored to reflect dependent atelectasis, less likely aspiration.  Trace right pleural effusion.  No pneumothorax.  Visualized thyroid is unremarkable.  The heart is top normal in size.  No pericardial effusion.  No suspicious mediastinal or axillary lymphadenopathy.  Nondisplaced fractures of the anterolateral right 4th rib (series 3/image 30) and the lateral 5th-9th ribs.  IMPRESSION: Nondisplaced fractures of the right 4th-9th ribs.  Trace right pleural effusion.  Patchy opacities in the posterior upper and lower lobes, favored to reflect a dependent atelectasis, less likely aspiration.  CT ABDOMEN AND PELVIS  Findings:  Liver, spleen, and adrenal glands are within normal limits.  Calcification in the pancreatic head (series 3/image 64), possibly sequela of prior/chronic pancreatitis.  Gallbladder is unremarkable.  No intrahepatic or extrahepatic ductal dilatation.  Kidneys are within normal limits.  No hydronephrosis.  No evidence of bowel obstruction.  No abdominopelvic ascites.  No hemoperitoneum.  No free air.  Atherosclerotic calcifications of the abdominal aorta and branch vessels. Intraluminal calcification within the left femoral vein (series 3/image 125 and 135) with associated left lower abdominopelvic collaterals (series 3/image 120), favored to reflect sequela of chronic deep venous thrombosis with collateralization. The bilateral iliac venous system is narrowed but symmetric.  No suspicious abdominopelvic lymphadenopathy.  Prostate is unremarkable.  Bladder is within normal limits.  Mild degenerative changes of the lumbar spine.  No fracture is seen.  IMPRESSION: No evidence of traumatic injury to the abdomen/pelvis.  Suspected sequela of chronic left femoral DVT with collateralization.   Original Report Authenticated By: Charline Bills, M.D.    Dg Chest Portable 1 View  09/07/2012  *RADIOLOGY REPORT*  Clinical Data: Status post fall.   PORTABLE CHEST - 1 VIEW  Comparison: None.  Findings: Bilateral upper lobe airspace opacities are identified. No pneumothorax is seen.  There is cardiomegaly.  The mediastinum appears widened which is likely due to portable technique and supine positioning.  IMPRESSION:  1.  Bilateral upper lobe opacities could reflect pulmonary contusions in the setting of trauma. 2.  Widened mediastinum is likely due to technical factors.   Original Report Authenticated By: Holley Dexter, M.D.     Assessment/Plan: Doing well POD 1. CT today looks excellent. Ok to transfer to floor and increase activity. Home soon as long as rib pain is ok.   LOS: 1 day  as above   Reinaldo Meeker, MD 09/08/2012, 8:41 AM

## 2012-09-09 MED ORDER — BACITRACIN-NEOMYCIN-POLYMYXIN 400-5-5000 EX OINT
TOPICAL_OINTMENT | CUTANEOUS | Status: AC
  Administered 2012-09-09: 19:00:00
  Filled 2012-09-09: qty 2

## 2012-09-09 NOTE — Progress Notes (Signed)
Physical Therapy Treatment Patient Details Name: Frank Scott MRN: [id number] DOB: 04-Jun-1951 Today's Date: 09/09/2012 Time: 3086-5784 PT Time Calculation (min): 27 min  PT Assessment / Plan / Recommendation Comments on Treatment Session  Pt is a 61 y.o./M s/p fall resulting in scull fx with crainiotomy and rib fx's. Progressing towards goals today with incr activity and slightly less assistance required. Flat affect with limited verbal responces to questions (yes, no, 2-3 sentence answer's).              Follow Up Recommendations  CIR           Equipment Recommendations  Rolling walker with 5" wheels    Recommendations for Other Services Rehab consult  Frequency Min 5X/week   Plan Discharge plan remains appropriate;Frequency remains appropriate    Precautions / Restrictions Precautions Precautions: Fall Restrictions Weight Bearing Restrictions: No       Mobility  Bed Mobility Bed Mobility: Rolling Right Rolling Right: 3: Mod assist;With rail Supine to Sit: 1: +2 Total assist;With rails Supine to Sit: Patient Percentage: 60% Sitting - Scoot to Edge of Bed: 4: Min assist Details for Bed Mobility Assistance: Directional cues for each aspect of mobility (bend knees, roll onto side, reach for rails, move legs off bed, use of arms to sit up, ect). Assist needed to advance LE's off edge of bed and to elevate trunk into seated position. Transfers Sit to Stand: 1: +2 Total assist;From bed;With upper extremity assist Sit to Stand: Patient Percentage: 80% Stand to Sit: 1: +2 Total assist;To chair/3-in-1;With upper extremity assist;With armrests Stand to Sit: Patient Percentage: 70% Details for Transfer Assistance: cues for hand placment for safety with all transfers,  for anterior wt shifting to assist with standing and to control descent. Pt with delay in hip flexion with sitting (stiff/guarded movements) leading into a uncontrolled descent into  chair. Ambulation/Gait Ambulation/Gait Assistance: 1: +2 Total assist Ambulation/Gait: Patient Percentage: 70% Ambulation Distance (Feet): 60 Feet (+10 feet to bathroom before hall ambulation) Assistive device: Rolling walker Ambulation/Gait Assistance Details: min cues for upright posture, to decr UE reliance on walker, incr BOS, incr step length with gait. 44ft without AD with assist for balance. Demo'd decr stability with lateral veering with gait without AD. Gait Pattern: Step-through pattern;Decreased stride length;Trunk flexed;Narrow base of support;Shuffle;Decreased step length - right;Decreased step length - left Gait velocity: Decreased General Gait Details: Pt reportes incr'd rib/chest wall pain with standing/gait without walker vs with UE wt bearing on walker.      PT Goals Acute Rehab PT Goals PT Goal: Supine/Side to Sit - Progress: Progressing toward goal Pt will go Sit to Stand: with supervision PT Goal: Sit to Stand - Progress: Progressing toward goal Pt will go Stand to Sit: with supervision PT Goal: Stand to Sit - Progress: Progressing toward goal Pt will Ambulate: >150 feet;with supervision;with least restrictive assistive device PT Goal: Ambulate - Progress: Progressing toward goal Pt will Go Up / Down Stairs: 1-2 stairs;with min assist;with least restrictive assistive device PT Goal: Up/Down Stairs - Progress: Other (comment) ((NT))  Visit Information  Last PT Received On: 09/09/12 Assistance Needed: +2 (for safety with gait) PT/OT Co-Evaluation/Treatment: Yes    Subjective Data  Subjective: No new complaints, reports rib pain and mild headache at this time.   Cognition  Cognition Overall Cognitive Status: Impaired Area of Impairment: Attention;Awareness of deficits;Problem solving Arousal/Alertness: Awake/alert Orientation Level: Appears intact for tasks assessed Behavior During Session: Flat affect Current Attention Level: Sustained Problem Solving: Needs  directional cues for all mobility with step by step instruction at time (bed mobility, transfers, gait, ect). Cognition - Other Comments: Slow to respond.  May also be related to pain meds. More alert with session with eyes open throughout.    Balance  Static Standing Balance Static Standing - Balance Support: No upper extremity supported;During functional activity (pt voiding into toilet and then washing hands at sink.) Static Standing - Level of Assistance: 4: Min assist  End of Session PT - End of Session Equipment Utilized During Treatment: Gait belt (low on waist due to rib fx's) Activity Tolerance: Patient limited by fatigue;Patient tolerated treatment well Patient left: in chair;with call bell/phone within reach;with family/visitor present;Other (comment) (OT still in room completing evaluation) Nurse Communication: Mobility status   GP     Sallyanne Kuster 09/09/2012, 2:24 PM  Sallyanne Kuster, PTA Office- [phone]

## 2012-09-09 NOTE — Progress Notes (Signed)
Patient ID: Frank Scott, male   DOB: 1951/04/06, 62 y.o.   MRN: [id number]   LOS: 2 days   Subjective: No c/o, pain controlled.   Objective: Vital signs in last 24 hours: Temp:  [98.7 F (37.1 C)-100.2 F (37.9 C)] 98.7 F (37.1 C) (03/16 0614) Pulse Rate:  [61-85] 72 (03/16 0614) Resp:  [10-25] 17 (03/16 0614) BP: (129-177)/(71-93) 129/74 mmHg (03/16 0614) SpO2:  [88 %-98 %] 90 % (03/16 0614)    IS:   Physical Exam General appearance: alert and no distress Resp: clear to auscultation bilaterally Cardio: regular rate and rhythm GI: normal findings: bowel sounds normal and soft, non-tender Neuro: A&A   Assessment/Plan: Fall  Open depressed skull fx s/p elevation  TBI w/SAH, ICC -- Expected evolution on CT, will d/c abx after today's dose Multiple right rib fxs -- Pain control and pulmonary toilet  FEN -- No issues VTE -- SCD's  Dispo -- Awaiting OT consult, PT/ST recommending CIR, will consult    Freeman Caldron, PA-C Pager: [phone] General Trauma PA Pager: [phone]   09/09/2012

## 2012-09-09 NOTE — Progress Notes (Signed)
T=100.2 pt. Encouraged to use incentive spirometer. Reinforced instructions to patient and family. Will continue to monitor. KYoung RN

## 2012-09-09 NOTE — Progress Notes (Signed)
Patient looks great.  Possible CIR soon.  This patient has been seen and I agree with the findings and treatment plan.  [name]. Gae Bon, MD, FACS [phone] (pager) [phone] (direct pager) Trauma Surgeon

## 2012-09-09 NOTE — Progress Notes (Signed)
Occupational Therapy Evaluation Patient Details Name: Frank Scott MRN: [id number] DOB: 1950/10/24 Today's Date: 09/09/2012 Time: 7829-5621 OT Time Calculation (min): 47 min  OT Assessment / Plan / Recommendation Clinical Impression  62 yo s/p 20 ft fall from scaffold resulting in R frontal skull fracture and multiple rib fractures on R. Underwent R frontal craniotomy. Pt with subdural and subarachnoid intrapaenchymal hemmorrhages and contusive changes in R frontal lobe.  Pt presents with cognitive deficits and is limited by pain, which severely affects his baseline level of functioning with mobility and ADL. Pt currently thinks he is at [facility]. Pt will benefit from skilled OT services to facilitate D/C to CIR due to below deficits. Pt has very supportive family who can provide 24/7 S at D/C.     OT Assessment  Patient needs continued OT Services    Follow Up Recommendations  CIR    Barriers to Discharge None    Equipment Recommendations  3 in 1 bedside comode;Tub/shower bench    Recommendations for Other Services Rehab consult  Frequency  Min 2X/week    Precautions / Restrictions Precautions Precautions: Fall Precaution Comments: cognitive deficits Restrictions Weight Bearing Restrictions: No   Pertinent Vitals/Pain C/o headache    ADL  Eating/Feeding: Supervision/safety;Set up Where Assessed - Eating/Feeding: Chair Grooming: Moderate assistance Where Assessed - Grooming: Supported sitting Upper Body Bathing: Moderate assistance Where Assessed - Upper Body Bathing: Supported sitting Lower Body Bathing: Maximal assistance Where Assessed - Lower Body Bathing: Supported sit to stand Upper Body Dressing: Maximal assistance Where Assessed - Upper Body Dressing: Supported sitting Lower Body Dressing: Maximal assistance Where Assessed - Lower Body Dressing: Supported sit to Pharmacist, hospital: Moderate assistance;Other (comment) (+2 for safety) Toilet Transfer  Method: Other (comment) (ambulating) Toilet Transfer Equipment: Comfort height toilet Toileting - Clothing Manipulation and Hygiene: Minimal assistance Where Assessed - Glass blower/designer Manipulation and Hygiene: Standing Tub/Shower Transfer: Maximal assistance Tub/Shower Transfer Method: Stand pivot Transfers/Ambulation Related to ADLs: +2 for safety. mod a sit - stand ADL Comments: limited by pain and cognitive deficits    OT Diagnosis: Generalized weakness;Acute pain;Cognitive deficits  OT Problem List: Decreased strength;Decreased range of motion;Decreased activity tolerance;Impaired balance (sitting and/or standing);Decreased coordination;Decreased cognition;Decreased knowledge of use of DME or AE;Decreased safety awareness;Decreased knowledge of precautions;Obesity;Pain OT Treatment Interventions: Self-care/ADL training;Therapeutic exercise;Energy conservation;DME and/or AE instruction;Therapeutic activities;Cognitive remediation/compensation;Patient/family education;Balance training   OT Goals Acute Rehab OT Goals OT Goal Formulation: With patient/family Time For Goal Achievement: 09/23/12 Potential to Achieve Goals: Good ADL Goals Pt Will Perform Grooming: with supervision;Standing at sink;Unsupported;with cueing (comment type and amount) ( min vc in min distracting environment) ADL Goal: Grooming - Progress: Goal set today Pt Will Perform Upper Body Bathing: with supervision;Unsupported;with cueing (comment type and amount) (min vc i min distracting environment) ADL Goal: Upper Body Bathing - Progress: Goal set today Pt Will Perform Upper Body Dressing: with min assist;Unsupported;with cueing (comment type and amount) (min vc) ADL Goal: Upper Body Dressing - Progress: Goal set today Pt Will Transfer to Toilet: with supervision;Ambulation;with DME ADL Goal: Toilet Transfer - Progress: Goal set today Additional ADL Goal #1: Complete 2 step functional task with min vc in mod  distracting environment. ADL Goal: Additional Goal #1 - Progress: Goal set today  Visit Information  Last OT Received On: 09/09/12 Assistance Needed: +2 PT/OT Co-Evaluation/Treatment: Yes    Subjective Data      Prior Functioning     Home Living Lives With: Spouse Available Help at Discharge: Family;Available 24 hours/day  Type of Home: House Home Access: Stairs to enter [employer] of Steps: 2 Entrance Stairs-Rails: Right Home Layout: One level Bathroom Shower/Tub: Engineer, manufacturing systems: Standard Bathroom Accessibility: Yes How Accessible: Accessible via walker Home Adaptive Equipment: None Prior Function Level of Independence: Independent Able to Take Stairs?: Yes Driving: Yes Vocation: Full time employment Comments: worked at [employer]: No difficulties Dominant Hand: Right         Vision/Perception Vision - History Baseline Vision: No visual deficits Vision - Assessment Eye Alignment: Within Functional Limits Vision Assessment: Vision tested Ocular Range of Motion: Within Functional Limits Alignment/Gaze Preference: Within Defined Limits Tracking/Visual Pursuits: Able to track stimulus in all quads without difficulty Saccades: Within functional limits Additional Comments: Did not test fields. Will further assess Perception Perception: Within Functional Limits Praxis Praxis: Impaired Praxis Impairment Details: Initiation Praxis-Other Comments: delayed processing and initiation   Cognition  Cognition Overall Cognitive Status: Impaired Area of Impairment: Attention;Memory;Safety/judgement;Awareness of errors;Awareness of deficits;Problem solving;Executive functioning Arousal/Alertness: Awake/alert Orientation Level: Disoriented to;Place;Time (reports he is in [facility] hospital) Behavior During Session: Flat affect Current Attention Level: Sustained Attention - Other Comments: more difficulty with balance as  distraction increases Memory: Decreased recall of precautions Safety/Judgement: Decreased awareness of safety precautions;Decreased awareness of need for assistance Safety/Judgement - Other Comments: family states he thinks he can drive when discharged Awareness of Errors: Assistance required to identify errors made;Assistance required to correct errors made Awareness of Errors - Other Comments: emergent awareness Awareness of Deficits: poor awareness of balance and cognitive deficits Problem Solving: mod A functional basic Executive Functioning: impaired Cognition - Other Comments: Family educated on cognitive deficits associated with Frontal injuries    Extremity/Trunk Assessment Right Upper Extremity Assessment RUE ROM/Strength/Tone: Unable to fully assess;Due to pain RUE Sensation: WFL - Light Touch;WFL - Proprioception RUE Coordination: WFL - fine motor (decreased gross motor due to pain) Left Upper Extremity Assessment LUE ROM/Strength/Tone: Unable to fully assess;Due to pain (does not raise either hand. states it is dueto pain) Trunk Assessment Trunk Assessment: Other exceptions (affected by pain)     Mobility Bed Mobility Bed Mobility: Rolling Right;Right Sidelying to Sit Rolling Right: 1: +2 Total assist Rolling Right: Patient Percentage: 50% Right Sidelying to Sit: 1: +2 Total assist;With rails Right Sidelying to Sit: Patient Percentage: 60% Supine to Sit: 1: +2 Total assist;With rails Supine to Sit: Patient Percentage: 60% Sitting - Scoot to Edge of Bed: 4: Min assist Details for Bed Mobility Assistance: max directional cues. difficulty with initiation Transfers Transfers: Sit to Stand;Stand to Sit Sit to Stand: 1: +2 Total assist;From bed;With upper extremity assist Sit to Stand: Patient Percentage: 80% Stand to Sit: 1: +2 Total assist;To chair/3-in-1;With upper extremity assist;With armrests Stand to Sit: Patient Percentage: 70% Details for Transfer Assistance: cues  for hand placment for safety with all transfers,  for anterior wt shifting to assist with standing and to control descent. Pt with delay in hip flexion with sitting (stiff/guarded movements) leading into a uncontrolled descent into chair.     Exercise     Balance Static Standing Balance Static Standing - Balance Support: No upper extremity supported;During functional activity Static Standing - Level of Assistance: 4: Min assist (decreased postural control)   End of Session OT - End of Session Equipment Utilized During Treatment: Gait belt Activity Tolerance: Patient tolerated treatment well Patient left: in chair;with call bell/phone within reach;with family/visitor present Nurse Communication: Mobility status  GO     Nello Corro,HILLARY 09/09/2012, 2:51 PM [facility],  OTR/L  [phone] 09/09/2012

## 2012-09-09 NOTE — Progress Notes (Signed)
Patient ID: Frank Scott, male   DOB: Oct 12, 1950, 62 y.o.   MRN: [id number] Subjective: Patient reports no problems voiding  Objective: Vital signs in last 24 hours: Temp:  [98.7 F (37.1 C)-100.2 F (37.9 C)] 98.7 F (37.1 C) (03/16 0614) Pulse Rate:  [67-85] 72 (03/16 0614) Resp:  [17-25] 17 (03/16 0614) BP: (129-177)/(71-93) 129/74 mmHg (03/16 0614) SpO2:  [90 %-98 %] 90 % (03/16 0614)  Intake/Output from previous day: 03/15 0701 - 03/16 0700 In: 401 [P.O.:240; I.V.:111; IV Piggyback:50] Out: 100 [Urine:100] Intake/Output this shift:    awake, alert; no new deficits; wound looks excellent  Lab Results:  Recent Labs  09/07/12 1152 09/08/12 0540  WBC 9.1 8.9  HGB 14.9 13.6  HCT 42.1 39.3  PLT 199 180   BMET  Recent Labs  09/07/12 1152 09/08/12 0540  NA 137 135  K 3.9 4.6  CL 102 101  CO2 22 23  GLUCOSE 143* 143*  BUN 15 16  CREATININE 1.03 0.93  CALCIUM 8.7 8.5    Studies/Results: Ct Head Without Contrast  09/08/2012  *RADIOLOGY REPORT*  Clinical Data: Postoperative follow-up.  Fell from scaffolding.  No new complaints.  CT HEAD WITHOUT CONTRAST  Technique:  Contiguous axial images were obtained from the base of the skull through the vertex without contrast.  Comparison: 09/07/2012  Findings: Interval postoperative changes with plate and screw fixation of right frontal bone fractures.  There is been interval reduction of depressed fragments with minimal residual depression noted.  There is subcutaneous scalp hematoma and gas consistent with postoperative changes.  Skin clips are present.  Small residual subdural, intraparenchymal, and subarachnoid hematoma in the right frontal region with developing low attenuation change in the right frontal region consistent with usual evolution of hemorrhage and contusion.  No significant mass effect or midline shift.  Ventricles are not dilated.  No new areas of hemorrhage. Retention cyst in the right maxillary antrum.   IMPRESSION: Interval postoperative changes in the right frontal region as described.  Involving subdural and subarachnoid and intraparenchymal hemorrhages and contusive changes in the right frontal lobe.  No new hemorrhage or significant mass effect.   Original Report Authenticated By: Burman Nieves, M.D.    Ct Head Wo Contrast  09/07/2012  *RADIOLOGY REPORT*  Clinical Data:  Status post fall.  CT HEAD WITHOUT CONTRAST CT CERVICAL SPINE WITHOUT CONTRAST  Technique:  Multidetector CT imaging of the head and cervical spine was performed following the standard protocol without intravenous contrast.  Multiplanar CT image reconstructions of the cervical spine were also generated.  Comparison:   None  CT HEAD  Findings: The patient has a comminuted and depressed right frontal bone fracture.  Depression is estimated at approximately 1 cm. Associated pneumocephalus is noted.  There is underlying frontal lobe contusion with the largest area involved measuring approximately 3 x 1.5 cm.  Subarachnoid blood is also seen about the site of the fracture.  No other hemorrhage is identified. There is no midline shift or hydrocephalus.  The calvarium is otherwise intact.  IMPRESSION:  Depressed right frontal bone fracture with associated parenchymal contusion and small amount of subarachnoid blood.  CT CERVICAL SPINE  Findings: No fracture or subluxation of the cervical spine is identified.  There is some loss of disc space height and endplate spurring at C5-6.  No epidural hematoma is visualized.  IMPRESSION: No acute finding.   Original Report Authenticated By: Holley Dexter, M.D.    Ct Chest W Contrast  09/07/2012  *  RADIOLOGY REPORT*  Clinical Data:  Fall from approximately 30 feet, right flank/chest pain  CT CHEST, ABDOMEN AND PELVIS WITH CONTRAST  Technique:  Multidetector CT imaging of the chest, abdomen and pelvis was performed following the standard protocol during bolus administration of intravenous contrast.   Contrast: OMNIPAQUE IOHEXOL 300 MG/ML  SOLN  Comparison:  None.  CT CHEST  Findings:  No evidence of traumatic aortic injury.  No mediastinal hematoma.  Patchy opacities in the posterior upper and lower lobes, favored to reflect dependent atelectasis, less likely aspiration.  Trace right pleural effusion.  No pneumothorax.  Visualized thyroid is unremarkable.  The heart is top normal in size.  No pericardial effusion.  No suspicious mediastinal or axillary lymphadenopathy.  Nondisplaced fractures of the anterolateral right 4th rib (series 3/image 30) and the lateral 5th-9th ribs.  IMPRESSION: Nondisplaced fractures of the right 4th-9th ribs.  Trace right pleural effusion.  Patchy opacities in the posterior upper and lower lobes, favored to reflect a dependent atelectasis, less likely aspiration.  CT ABDOMEN AND PELVIS  Findings:  Liver, spleen, and adrenal glands are within normal limits.  Calcification in the pancreatic head (series 3/image 64), possibly sequela of prior/chronic pancreatitis.  Gallbladder is unremarkable.  No intrahepatic or extrahepatic ductal dilatation.  Kidneys are within normal limits.  No hydronephrosis.  No evidence of bowel obstruction.  No abdominopelvic ascites.  No hemoperitoneum.  No free air.  Atherosclerotic calcifications of the abdominal aorta and branch vessels. Intraluminal calcification within the left femoral vein (series 3/image 125 and 135) with associated left lower abdominopelvic collaterals (series 3/image 120), favored to reflect sequela of chronic deep venous thrombosis with collateralization. The bilateral iliac venous system is narrowed but symmetric.  No suspicious abdominopelvic lymphadenopathy.  Prostate is unremarkable.  Bladder is within normal limits.  Mild degenerative changes of the lumbar spine.  No fracture is seen.  IMPRESSION: No evidence of traumatic injury to the abdomen/pelvis.  Suspected sequela of chronic left femoral DVT with collateralization.    Original Report Authenticated By: Charline Bills, M.D.    Ct Cervical Spine Wo Contrast  09/07/2012  *RADIOLOGY REPORT*  Clinical Data:  Status post fall.  CT HEAD WITHOUT CONTRAST CT CERVICAL SPINE WITHOUT CONTRAST  Technique:  Multidetector CT imaging of the head and cervical spine was performed following the standard protocol without intravenous contrast.  Multiplanar CT image reconstructions of the cervical spine were also generated.  Comparison:   None  CT HEAD  Findings: The patient has a comminuted and depressed right frontal bone fracture.  Depression is estimated at approximately 1 cm. Associated pneumocephalus is noted.  There is underlying frontal lobe contusion with the largest area involved measuring approximately 3 x 1.5 cm.  Subarachnoid blood is also seen about the site of the fracture.  No other hemorrhage is identified. There is no midline shift or hydrocephalus.  The calvarium is otherwise intact.  IMPRESSION:  Depressed right frontal bone fracture with associated parenchymal contusion and small amount of subarachnoid blood.  CT CERVICAL SPINE  Findings: No fracture or subluxation of the cervical spine is identified.  There is some loss of disc space height and endplate spurring at C5-6.  No epidural hematoma is visualized.  IMPRESSION: No acute finding.   Original Report Authenticated By: Holley Dexter, M.D.    Ct Abdomen Pelvis W Contrast  09/07/2012  *RADIOLOGY REPORT*  Clinical Data:  Fall from approximately 30 feet, right flank/chest pain  CT CHEST, ABDOMEN AND PELVIS WITH CONTRAST  Technique:  Multidetector CT imaging of the chest, abdomen and pelvis was performed following the standard protocol during bolus administration of intravenous contrast.  Contrast: OMNIPAQUE IOHEXOL 300 MG/ML  SOLN  Comparison:  None.  CT CHEST  Findings:  No evidence of traumatic aortic injury.  No mediastinal hematoma.  Patchy opacities in the posterior upper and lower lobes, favored to reflect  dependent atelectasis, less likely aspiration.  Trace right pleural effusion.  No pneumothorax.  Visualized thyroid is unremarkable.  The heart is top normal in size.  No pericardial effusion.  No suspicious mediastinal or axillary lymphadenopathy.  Nondisplaced fractures of the anterolateral right 4th rib (series 3/image 30) and the lateral 5th-9th ribs.  IMPRESSION: Nondisplaced fractures of the right 4th-9th ribs.  Trace right pleural effusion.  Patchy opacities in the posterior upper and lower lobes, favored to reflect a dependent atelectasis, less likely aspiration.  CT ABDOMEN AND PELVIS  Findings:  Liver, spleen, and adrenal glands are within normal limits.  Calcification in the pancreatic head (series 3/image 64), possibly sequela of prior/chronic pancreatitis.  Gallbladder is unremarkable.  No intrahepatic or extrahepatic ductal dilatation.  Kidneys are within normal limits.  No hydronephrosis.  No evidence of bowel obstruction.  No abdominopelvic ascites.  No hemoperitoneum.  No free air.  Atherosclerotic calcifications of the abdominal aorta and branch vessels. Intraluminal calcification within the left femoral vein (series 3/image 125 and 135) with associated left lower abdominopelvic collaterals (series 3/image 120), favored to reflect sequela of chronic deep venous thrombosis with collateralization. The bilateral iliac venous system is narrowed but symmetric.  No suspicious abdominopelvic lymphadenopathy.  Prostate is unremarkable.  Bladder is within normal limits.  Mild degenerative changes of the lumbar spine.  No fracture is seen.  IMPRESSION: No evidence of traumatic injury to the abdomen/pelvis.  Suspected sequela of chronic left femoral DVT with collateralization.   Original Report Authenticated By: Charline Bills, M.D.    Dg Chest Portable 1 View  09/07/2012  *RADIOLOGY REPORT*  Clinical Data: Status post fall.  PORTABLE CHEST - 1 VIEW  Comparison: None.  Findings: Bilateral upper lobe  airspace opacities are identified. No pneumothorax is seen.  There is cardiomegaly.  The mediastinum appears widened which is likely due to portable technique and supine positioning.  IMPRESSION:  1.  Bilateral upper lobe opacities could reflect pulmonary contusions in the setting of trauma. 2.  Widened mediastinum is likely due to technical factors.   Original Report Authenticated By: Holley Dexter, M.D.     Assessment/Plan: Doing very well POD 2. Home anytime from my standpooint. Follow up in office in 8 days.   LOS: 2 days  as above   Reinaldo Meeker, MD 09/09/2012, 11:13 AM

## 2012-09-10 ENCOUNTER — Encounter (HOSPITAL_COMMUNITY): Payer: Self-pay | Admitting: Physical Medicine and Rehabilitation

## 2012-09-10 DIAGNOSIS — W12XXXA Fall on and from scaffolding, initial encounter: Secondary | ICD-10-CM

## 2012-09-10 DIAGNOSIS — S069X9A Unspecified intracranial injury with loss of consciousness of unspecified duration, initial encounter: Secondary | ICD-10-CM

## 2012-09-10 MED ORDER — TRAMADOL HCL 50 MG PO TABS
50.0000 mg | ORAL_TABLET | Freq: Four times a day (QID) | ORAL | Status: DC | PRN
  Administered 2012-09-11: 100 mg via ORAL
  Filled 2012-09-10 (×2): qty 2

## 2012-09-10 NOTE — Consult Note (Signed)
Physical Medicine and Rehabilitation Consult  Reason for Consult: TBI Referring Physician:  Dr. Lindie Spruce   HPI: Frank Scott is a 61 y.o. 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 No recollection of fall but with +LOC and confusion at scene. He was admitted for work up 09/07/12 and was noted to have open depressed right frontal bone skull Fx with parenchymal contusion 3 X 1.5 cm and small amount of SAH, 4th- 9th right rib fractures, bilateral pulmonary contusions and sequelae of chronic L-femoral DVT. He was evaluated by Dr. Gerlene Fee and taken to OR for R-frontotemporal craniectomy with elevation of skull Fx the same day.   Follow up head CT with interval post op changes involving subdural, SAH, IPH and contusive changes in right frontal lobe. PT/OT/ST evaluations done on 03/15-patient noted to be confused with delayed processing with lethargy and has  moderate cognitive deficits. Therapy team recommending CIR.    Review of Systems  HENT: Negative for hearing loss.   Eyes: Positive for blurred vision (Right).  Respiratory: Positive for shortness of breath.   Cardiovascular: Positive for chest pain (Right chest wall pain).  Gastrointestinal: Positive for constipation. Negative for heartburn, nausea and vomiting.  Genitourinary: Negative for urgency and frequency.  Musculoskeletal: Positive for myalgias.  Neurological: Negative for dizziness, speech change and headaches.  Psychiatric/Behavioral: The patient does not have insomnia.    Past Medical History  Diagnosis Date  . DVT of deep femoral vein     Past Surgical History  Procedure Laterality Date  . Appendectomy     Family History  Problem Relation Age of Onset  . Deep vein thrombosis Daughter   . Clotting disorder Daughter   . Deep vein thrombosis Son   . Diabetes Mother   . Deep vein thrombosis Sister   . Deep vein thrombosis Brother     Social History:  Married. Works full time  at [employer]. Independent and active. Wife self-employed and works out of home. Also has supportive family. He reports that he has never smoked. He does not have any smokeless tobacco history on file. He reports that  drinks alcohol on the weekend.  He reports that he does not use illicit drugs.  Allergies: No Known Allergies  No prescriptions prior to admission    Home: Home Living Lives With: Spouse Available Help at Discharge: Family;Available 24 hours/day Type of Home: House Home Access: Stairs to enter [employer] of Steps: 2 Entrance Stairs-Rails: Right Home Layout: One level Bathroom Shower/Tub: Engineer, manufacturing systems: Standard Bathroom Accessibility: Yes How Accessible: Accessible via walker Home Adaptive Equipment: None  Functional History: Prior Function Able to Take Stairs?: Yes Driving: Yes Vocation: Full time employment Comments: worked at [employer] Functional Status:  Mobility: Bed Mobility Bed Mobility: Rolling Right;Right Sidelying to Sit Rolling Right: 1: +2 Total assist Rolling Right: Patient Percentage: 50% Right Sidelying to Sit: 1: +2 Total assist;With rails Right Sidelying to Sit: Patient Percentage: 60% Supine to Sit: 1: +2 Total assist;With rails Supine to Sit: Patient Percentage: 60% Sitting - Scoot to Edge of Bed: 4: Min assist Transfers Transfers: Sit to Stand;Stand to Sit Sit to Stand: 1: +2 Total assist;From bed;With upper extremity assist Sit to Stand: Patient Percentage: 80% Stand to Sit: 1: +2 Total assist;To chair/3-in-1;With upper extremity assist;With armrests Stand to Sit: Patient Percentage: 70% Ambulation/Gait Ambulation/Gait Assistance: 1: +2 Total assist Ambulation/Gait: Patient Percentage: 70% Ambulation Distance (Feet): 60 Feet (+10 feet  to bathroom before hall ambulation) Assistive device: Rolling walker Ambulation/Gait Assistance Details: min cues for upright posture, to decr UE reliance on walker, incr BOS, incr  step length with gait. 26ft without AD with assist for balance. Demo'd decr stability with lateral veering with gait without AD. Gait Pattern: Step-through pattern;Decreased stride length;Trunk flexed;Narrow base of support;Shuffle;Decreased step length - right;Decreased step length - left Gait velocity: Decreased General Gait Details: Pt reportes incr'd rib/chest wall pain with standing/gait without walker vs with UE wt bearing on walker.    ADL: ADL Eating/Feeding: Supervision/safety;Set up Where Assessed - Eating/Feeding: Chair Grooming: Moderate assistance Where Assessed - Grooming: Supported sitting Upper Body Bathing: Moderate assistance Where Assessed - Upper Body Bathing: Supported sitting Lower Body Bathing: Maximal assistance Where Assessed - Lower Body Bathing: Supported sit to stand Upper Body Dressing: Maximal assistance Where Assessed - Upper Body Dressing: Supported sitting Lower Body Dressing: Maximal assistance Where Assessed - Lower Body Dressing: Supported sit to Pharmacist, hospital: Moderate assistance;Other (comment) (+2 for safety) Toilet Transfer Method: Other (comment) (ambulating) Toilet Transfer Equipment: Comfort height toilet Tub/Shower Transfer: Maximal assistance Tub/Shower Transfer Method: Stand pivot Transfers/Ambulation Related to ADLs: +2 for safety. mod a sit - stand ADL Comments: limited by pain and cognitive deficits  Cognition: Cognition Overall Cognitive Status: Impaired Arousal/Alertness: Awake/alert Orientation Level: Oriented X4 Attention: Focused Focused Attention: Impaired Focused Attention Impairment: Verbal complex;Functional basic;Functional complex Memory: Impaired Memory Impairment: Retrieval deficit;Decreased recall of new information;Storage deficit Awareness: Appears intact Problem Solving: Impaired Problem Solving Impairment: Verbal complex;Functional basic;Functional complex Executive Function: Initiating;Self  Correcting;Self Monitoring Initiating: Impaired Initiating Impairment: Verbal complex;Functional basic;Functional complex Self Monitoring: Impaired Self Monitoring Impairment: Verbal complex;Functional basic;Functional complex Self Correcting: Impaired Self Correcting Impairment: Verbal complex;Functional basic;Functional complex Cognition Overall Cognitive Status: Impaired Area of Impairment: Attention;Memory;Safety/judgement;Awareness of errors;Awareness of deficits;Problem solving;Executive functioning Arousal/Alertness: Awake/alert Orientation Level: Disoriented to;Place;Time (reports he is in [facility] hospital) Behavior During Session: Flat affect Current Attention Level: Sustained Attention - Other Comments: more difficulty with balance as distraction increases Memory: Decreased recall of precautions Safety/Judgement: Decreased awareness of safety precautions;Decreased awareness of need for assistance Safety/Judgement - Other Comments: family states he thinks he can drive when discharged Awareness of Errors: Assistance required to identify errors made;Assistance required to correct errors made Awareness of Errors - Other Comments: emergent awareness Awareness of Deficits: poor awareness of balance and cognitive deficits Problem Solving: mod A functional basic Executive Functioning: impaired Cognition - Other Comments: Family educated on cognitive deficits associated with Frontal injuries  Blood pressure 143/85, pulse 72, temperature 98.2 F (36.8 C), temperature source Oral, resp. rate 18, height 6' (1.829 m), weight 130.6 kg (287 lb 14.7 oz), SpO2 99.00%. Physical Exam  Nursing note and vitals reviewed. Constitutional: He is oriented to person, place, and time. He appears well-developed and well-nourished.  HENT:  Head: Normocephalic.  Right frontal incision boggy and staples in place with minimal drainage.   Eyes: Pupils are equal, round, and reactive to light.  Right eye lid  edematous.   Neck: Normal range of motion.  Cardiovascular: Normal rate and regular rhythm.   Pulmonary/Chest: Effort normal and breath sounds normal. No respiratory distress.  Abdominal: Bowel sounds are normal. He exhibits distension. There is no tenderness.  Musculoskeletal: He exhibits no edema.  Unable to raise right shoulder due to chest wall pain.   Neurological: He is alert and oriented to person, place, and time.  Speech clear. Able to follow commands without difficulty. Right lateral field deficits-diplopia? Suprisingly alert. Limited  by pain in shoulder and hip girdle movement. No focal motor or sensory deficits.  Psychiatric: He has a normal mood and affect. His behavior is normal. Judgment and thought content normal.    No results found for this or any previous visit (from the past 24 hour(s)). No results found.  Assessment/Plan: Diagnosis: TBI, polytrauma after fall 1. Does the need for close, 24 hr/day medical supervision in concert with the patient's rehab needs make it unreasonable for this patient to be served in a less intensive setting? Yes 2. Co-Morbidities requiring supervision/potential complications: pain, obesity 3. Due to bladder management, bowel management, safety, skin/wound care, disease management, medication administration, pain management and patient education, does the patient require 24 hr/day rehab nursing? Yes 4. Does the patient require coordinated care of a physician, rehab nurse, PT (1-2 hrs/day, 5 days/week), OT (1-2 hrs/day, 5 days/week) and SLP (1 hrs/day, 5 days/week) to address physical and functional deficits in the context of the above medical diagnosis(es)? Yes Addressing deficits in the following areas: balance, endurance, locomotion, strength, transferring, bowel/bladder control, bathing, dressing, feeding, grooming, toileting, cognition and psychosocial support 5. Can the patient actively participate in an intensive therapy program of at least  3 hrs of therapy per day at least 5 days per week? Yes 6. The potential for patient to make measurable gains while on inpatient rehab is excellent 7. Anticipated functional outcomes upon discharge from inpatient rehab are mod I to min assist with PT, mod I to min assist with OT, mod I with SLP. 8. Estimated rehab length of stay to reach the above functional goals is: 10 days 9. Does the patient have adequate social supports to accommodate these discharge functional goals? Yes 10. Anticipated D/C setting: Home 11. Anticipated post D/C treatments: HH therapy 12. Overall Rehab/Functional Prognosis: excellent  RECOMMENDATIONS: This patient's condition is appropriate for continued rehabilitative care in the following setting: CIR Patient has agreed to participate in recommended program. Yes Note that insurance prior authorization may be required for reimbursement for recommended care.  Comment:Rehab RN to follow up.   Ranelle Oyster, MD, [place] [facility]     09/10/2012

## 2012-09-10 NOTE — Clinical Social Work Note (Signed)
Clinical Social Work Department BRIEF PSYCHOSOCIAL ASSESSMENT 09/10/2012  Patient:  Frank Scott, Frank Scott     Account Number:  [id number]     Admit date:  09/07/2012  Clinical Social Worker:  Verl Blalock  Date/Time:  09/10/2012 02:15 PM  Referred by:  Physician  Date Referred:  09/10/2012 Referred for  Psychosocial assessment   Other Referral:   Interview type:  Patient Other interview type:   Patient daughter at bedside    PSYCHOSOCIAL DATA Living Status:  WIFE Admitted from facility:   Level of care:   Primary support name:  Frank Scott  [phone] Primary support relationship to patient:  SPOUSE Degree of support available:   Good    CURRENT CONCERNS Current Concerns  None Noted   Other Concerns:    SOCIAL WORK ASSESSMENT / PLAN Clinical Social Worker met with patient at bedside to offer support and discuss patient needs at discharge.  Patient states that he currently lives at home with his wife and plans to return.  Patient seems to be an excellent candidate for inpatient rehab just awaiting approval. Patient daughter at bedside and willing to assist as needed.  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  Patient has been employed there for 30 years and hopeful to return once physically able.    CSW to remain available for support and to complete SBIRT assessment prior to discharge.   Assessment/plan status:  Psychosocial Support/Ongoing Assessment of Needs Other assessment/ plan:   Information/referral to community resources:   Patient feels well connected to the community through [place] and has supportive family.  Patient understanding of social work role and will notfy if resources needed.    PATIENT'S/FAMILY'S RESPONSE TO PLAN OF CARE: Patient alert and oriented x3 sitting up in the chair. Patient and patient daughter are both agreeable to patient rehab needs with eventual return home.  Patient verbalized his  appreciation for CSW support.   Frank Scott, [place] [phone]

## 2012-09-10 NOTE — Progress Notes (Signed)
UR completed 

## 2012-09-10 NOTE — Progress Notes (Signed)
Patient ID: Frank Scott, male   DOB: [birth year], 62 y.o.   MRN: [id number] Subjective: Patient reports rib pain  Objective: Vital signs in last 24 hours: Temp:  [98.2 F (36.8 C)-99.3 F (37.4 C)] 98.8 F (37.1 C) (03/17 1430) Pulse Rate:  [69-77] 69 (03/17 1430) Resp:  [18] 18 (03/17 0516) BP: (143-166)/(81-87) 147/81 mmHg (03/17 1430) SpO2:  [94 %-99 %] 98 % (03/17 1430)  Intake/Output from previous day: 03/16 0701 - 03/17 0700 In: 590 [P.O.:590] Out: -  Intake/Output this shift: Total I/O In: 240 [P.O.:240] Out: 551 [Urine:550; Stool:1]  Wound:clean and dry  Lab Results:  Recent Labs  09/08/12 0540  WBC 8.9  HGB 13.6  HCT 39.3  PLT 180   BMET  Recent Labs  09/08/12 0540  NA 135  K 4.6  CL 101  CO2 23  GLUCOSE 143*  BUN 16  CREATININE 0.93  CALCIUM 8.5    Studies/Results: No results found.  Assessment/Plan: Doing well. Supposedly going to rehab in next day or so.    LOS: 3 days  As above   Reinaldo Meeker, MD 09/10/2012, 6:39 PM

## 2012-09-10 NOTE — Progress Notes (Signed)
Seen, agree with above.    Rehab

## 2012-09-10 NOTE — Progress Notes (Signed)
Patient ID: KEYONTA BARRADAS, male   DOB: Dec 02, 1950, 62 y.o.   MRN: [id number]   LOS: 3 days   Subjective: No new c/o.  Objective: Vital signs in last 24 hours: Temp:  [98 F (36.7 C)-99.3 F (37.4 C)] 98.2 F (36.8 C) (03/17 0516) Pulse Rate:  [72-77] 72 (03/17 0516) Resp:  [18] 18 (03/17 0516) BP: (143-166)/(82-87) 143/85 mmHg (03/17 0545) SpO2:  [92 %-99 %] 99 % (03/17 0516)    IS: (+214ml)   Physical Exam General appearance: alert and no distress Resp: clear to auscultation bilaterally Cardio: regular rate and rhythm GI: normal findings: bowel sounds normal and soft, non-tender Pulses: 2+ and symmetric Neuro: wounds C/D/I, A&A   Assessment/Plan: Fall  Open depressed skull fx s/p elevation  TBI w/SAH, ICC -- Stable Multiple right rib fxs -- Pain control and pulmonary toilet  FEN -- No issues  VTE -- SCD's  Dispo -- CIR hopefully next day or two    Freeman Caldron, PA-C Pager: [phone] General Trauma PA Pager: [phone]   09/10/2012

## 2012-09-11 MED ORDER — HYDRALAZINE HCL 20 MG/ML IJ SOLN
10.0000 mg | Freq: Four times a day (QID) | INTRAMUSCULAR | Status: DC | PRN
  Administered 2012-09-11: 10 mg via INTRAVENOUS
  Filled 2012-09-11 (×2): qty 0.5

## 2012-09-11 NOTE — Progress Notes (Signed)
Physical Therapy Treatment Patient Details Name: Frank Scott MRN: [id number] DOB: 12-05-1950 Today's Date: 09/11/2012 Time: 1478-2956 PT Time Calculation (min): 40 min  PT Assessment / Plan / Recommendation Comments on Treatment Session  Pt demonstrates deficits in functional mobility at this time but is making progress with therapy.  Patient demonstrates ability to correct gait with multimodal cueing but still requiring significant degree of assist. Patient's instability increases with fatigue.  Educated pt and daughter on importance of mobilization with nsg. Additionally, Daughter expressed concerns regarding her father's behavior last night, including getting disoriented in his room and not remembering events throughout the day. Worked with OT to educate pt on cognitive deficits associated with frontal injuries and assured her this was normal part of recovery. Pt is cooperative and motivated with strong family support. Continue to recommend CIR. Will follow to facilitate D/C to CIR.    Follow Up Recommendations  CIR     Does the patient have the potential to tolerate intense rehabilitation     Barriers to Discharge        Equipment Recommendations  Rolling walker with 5" wheels    Recommendations for Other Services Rehab consult  Frequency Min 5X/week   Plan Discharge plan remains appropriate;Frequency remains appropriate    Precautions / Restrictions Precautions Precautions: Fall Precaution Comments: cognitive deficits   Pertinent Vitals/Pain NAD no pain reported at this time    Mobility  Bed Mobility Bed Mobility: Not assessed Transfers Transfers: Sit to Stand;Stand to Sit Sit to Stand: 4: Min assist;From chair/3-in-1 Stand to Sit: 4: Min assist Details for Transfer Assistance: VC's for safety Ambulation/Gait Ambulation/Gait Assistance: 3: Mod assist Ambulation/Gait: Patient Percentage: 70% Ambulation Distance (Feet): 210 Feet (60 ft hand held) Assistive device:  Rolling walker;1 person hand held assist Ambulation/Gait Assistance Details: VC's to increase BOS; Upright posture and body position with RW Gait Pattern: Step-through pattern;Decreased stride length;Trunk flexed;Narrow base of support;Shuffle;Decreased step length - right;Decreased step length - left Gait velocity: Decreased General Gait Details: Pt with instability of gait; crossover scissoring with turns; more instability noted with fatigue Stairs: No       PT Goals Acute Rehab PT Goals PT Goal Formulation: With patient Time For Goal Achievement: 09/22/12 Potential to Achieve Goals: Good Pt will go Supine/Side to Sit: with supervision Pt will go Sit to Supine/Side: with supervision Pt will go Sit to Stand: with supervision PT Goal: Sit to Stand - Progress: Progressing toward goal Pt will go Stand to Sit: with supervision PT Goal: Stand to Sit - Progress: Progressing toward goal Pt will Ambulate: >150 feet;with supervision;with least restrictive assistive device PT Goal: Ambulate - Progress: Progressing toward goal Pt will Go Up / Down Stairs: 1-2 stairs;with min assist;with least restrictive assistive device  Visit Information  Last PT Received On: 09/11/12 Assistance Needed: +1    Subjective Data  Subjective: Feel okay   Cognition  Cognition Overall Cognitive Status: Impaired Area of Impairment: Attention;Memory;Safety/judgement;Awareness of deficits;Awareness of errors;Problem solving;Executive functioning Arousal/Alertness: Awake/alert Orientation Level: Oriented X4 / Intact Behavior During Session: Flat affect Current Attention Level: Sustained Attention - Other Comments: more difficulty with balance as distraction increases Memory: Decreased recall of precautions Memory Deficits: unable to reecall instructions after 1 min delay. used contextual cues for recall Safety/Judgement: Decreased awareness of safety precautions;Decreased awareness of need for  assistance Awareness of Errors: Assistance required to identify errors made;Assistance required to correct errors made Awareness of Errors - Other Comments: emergent awreness Awareness of Deficits: unaware of balance deficit  and impact on safety Problem Solving: mod A funcitonal Executive Functioning: impaired Cognition - Other Comments: slow processing. Pt's daughter notes confusion at times and disorientation    Armed forces operational officer Standing Balance Static Standing - Balance Support: During functional activity Static Standing - Level of Assistance: 4: Min assist  End of Session PT - End of Session Equipment Utilized During Treatment: Gait belt (low on waist due to rib fx's) Activity Tolerance: Patient limited by fatigue;Patient tolerated treatment well Patient left: in chair;with call bell/phone within reach;with family/visitor present;Other (comment) Nurse Communication: Mobility status   GP     Fabio Asa 09/11/2012, 3:26 PM Charlotte Crumb, PT DPT  [phone]

## 2012-09-11 NOTE — Progress Notes (Signed)
Awaiting Workmen's Compensation approval Ready for rehab.  Wilmon Arms. Corliss Skains, MD, [facility]  09/11/2012 11:04 AM

## 2012-09-11 NOTE — Progress Notes (Signed)
Notified Dr. Dwain Sarna of pt blood pressure 180/100 manual and HR of 65. Pt does not complain of pain. Received order for PRN hydralazine.

## 2012-09-11 NOTE — Progress Notes (Addendum)
Attempting to get Workers Comp info on patient. Called HR dept at [phone] but only reached endless loop of voicemail.  Called main [employer] number, [phone] and reached a weather message stating that the university wouldn't open until 12noon.  Will call back after 12 to see if I can get some info.    1215--Spoke with Sheron at [employer], [phone].  She says that the [facility] company is [employer], but that they were not aware of the patient yet because Sheron was still waiting for the report from pt's supervisor.  Asked if she could check on that as pt is ready for next level of care and can't transfer until [employer] can approve.  She said she would check on this and call me back. CIR admission coordinator notified.

## 2012-09-11 NOTE — Progress Notes (Signed)
Rehab admissions - Evaluated for possible admission.  I spoke with patient.  He is interested in coming to inpatient rehab prior to home.  He has workers Occupational hygienist.  I have talked to Marcelino Duster, Sports coach, to assist with finding out workers comp information.  Will follow progress for now.  Call me for questions.  #[phone]
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
## 2012-09-11 NOTE — Progress Notes (Signed)
Patient ID: Frank Scott, male   DOB: Oct 03, 1950, 62 y.o.   MRN: [id number]   LOS: 4 days   Subjective: Denies changes overnight, denies neuro symptoms, headaches, visual changes.  Objective: Vital signs in last 24 hours: Temp:  [88.2 F (31.2 C)-98.8 F (37.1 C)] 97 F (36.1 C) (03/18 0654) Pulse Rate:  [69-80] 73 (03/18 0511) Resp:  [20] 20 (03/18 0654) BP: (132-147)/(76-88) 132/76 mmHg (03/18 0654) SpO2:  [94 %-98 %] 98 % (03/18 0654) Last BM Date: 09/10/12     Physical Exam General: alert and NAD Resp: clear to auscultation bilaterally Cardio: regular rate and rhythm GI: normal findings: +bs, soft, non tender Neuro: awake, no focal neurological changes wounds C/D/I   Assessment/Plan: Fall  Open depressed skull fx s/p elevation, POD#4  TBI w/SAH, ICC -- Stable Multiple right rib fxs -- Pain control and pulmonary toilet  FEN -- No issues  VTE -- SCD's  Dispo -- CIR hopefully next day or two, hopefully have bed soon    Doristine Mango  General Trauma PA Pager: [phone]   09/11/2012

## 2012-09-12 DIAGNOSIS — S06339A Contusion and laceration of cerebrum, unspecified, with loss of consciousness of unspecified duration, initial encounter: Secondary | ICD-10-CM

## 2012-09-12 DIAGNOSIS — S0190XA Unspecified open wound of unspecified part of head, initial encounter: Secondary | ICD-10-CM

## 2012-09-12 NOTE — Progress Notes (Signed)
Physical Therapy Treatment Patient Details Name: Frank Scott MRN: [id number] DOB: Apr 11, 1951 Today's Date: 09/12/2012 Time: 1333-1400 PT Time Calculation (min): 27 min  PT Assessment / Plan / Recommendation Comments on Treatment Session  Pt continues to progress in mobility and is willing to participate in therapy.  Pt would still benefit from support of RW during ambulation to prevent lateral LOB.  Pt requires cues to maintain safe and proper technique.  Pt unable to follow 2 step instructions.  Based on observed cognitive and mobility deficits, CIR continues to be appropriate for this pt.    Follow Up Recommendations  CIR     Does the patient have the potential to tolerate intense rehabilitation     Barriers to Discharge        Equipment Recommendations  Rolling walker with 5" wheels    Recommendations for Other Services    Frequency Min 5X/week   Plan Discharge plan remains appropriate;Frequency remains appropriate    Precautions / Restrictions Precautions Precautions: Fall Precaution Comments: cognitive deficits Restrictions Weight Bearing Restrictions: No   Pertinent Vitals/Pain Pt denies pain, indicates discomfort on right side of rib cage.    Mobility  Bed Mobility Bed Mobility: Not assessed Transfers Transfers: Sit to Stand;Stand to Sit Sit to Stand: 4: Min guard;With upper extremity assist;With armrests;From chair/3-in-1 Stand to Sit: 4: Min guard;With upper extremity assist;To chair/3-in-1 Details for Transfer Assistance: Cues for hand placement. Ambulation/Gait Ambulation/Gait Assistance: 4: Min assist Ambulation Distance (Feet): 250 Feet Assistive device: 1 person hand held assist;None Ambulation/Gait Assistance Details: left lateral lean without HHA, needed (A) to correct. Gait Pattern: Step-through pattern General Gait Details: Pt with more normal gait pattern and velocity this session but experienced left lateral lean/LOB without HHA.  Stairs: No     Exercises General Exercises - Lower Extremity Ankle Circles/Pumps: AROM;Strengthening;Both;20 reps;Seated Long Arc Quad: AROM;Strengthening;Both;20 reps Hip ABduction/ADduction: AROM;Both;20 reps;Seated Straight Leg Raises: AROM;Strengthening;Both;Seated;10 reps   PT Diagnosis:    PT Problem List:   PT Treatment Interventions:     PT Goals Acute Rehab PT Goals Time For Goal Achievement: 09/22/12 Pt will go Sit to Stand: with supervision PT Goal: Sit to Stand - Progress: Progressing toward goal Pt will go Stand to Sit: with supervision PT Goal: Stand to Sit - Progress: Progressing toward goal Pt will Ambulate: >150 feet;with supervision;with least restrictive assistive device PT Goal: Ambulate - Progress: Progressing toward goal  Visit Information  Last PT Received On: 09/12/12 Assistance Needed: +1    Subjective Data  Subjective: Pt denies pain, but indicates his rt side is sore.   Cognition  Cognition Overall Cognitive Status: Impaired Area of Impairment: Attention;Memory;Safety/judgement Arousal/Alertness: Awake/alert Orientation Level: Oriented X4 / Intact Behavior During Session: Flat affect Current Attention Level: Sustained Attention - Other Comments: Pt does not follow 2 step instructions such as walk to landmark and stop Memory Deficits: able to recall 1 of 4 exercises performed during this session. Safety/Judgement: Decreased awareness of safety precautions;Decreased awareness of need for assistance Safety/Judgement - Other Comments: Pt felt he could walk without (A) even after left lateral lean     Balance  Balance Balance Assessed: Yes Static Standing Balance Static Standing - Balance Support: No upper extremity supported;Bilateral upper extremity supported Static Standing - Level of Assistance: 4: Min assist Static Standing - Comment/# of Minutes: 2 (with perturbation at hips & shoulders) Single Leg Stance - Right Leg:  (<30 sec with UE support) Single  Leg Stance - Left Leg:  (<30 sec with  UE support) Tandem Stance - Right Leg:  (more difficult than left leg, initial UE support) Tandem Stance - Left Leg:  (initial UE support) Dynamic Standing Balance Dynamic Standing - Balance Support: No upper extremity supported Dynamic Standing - Level of Assistance: 4: Min assist Dynamic Standing - Balance Activities: Reaching for objects;Reaching across midline Dynamic Standing - Comments: Pt has increased discomfort reaching with Right UE due to soreness in ribs  End of Session PT - End of Session Equipment Utilized During Treatment: Gait belt Activity Tolerance: Patient tolerated treatment well Patient left: in chair;with call bell/phone within reach;with family/visitor present Nurse Communication: Mobility status   GP     Enid Baas, SPTA 09/12/2012, 3:49 PM

## 2012-09-12 NOTE — Progress Notes (Addendum)
Patient ID: Frank Scott, male   DOB: 1950/12/17, 62 y.o.   MRN: [id number]   LOS: 5 days   Subjective: Denies changes overnight, denies neuro symptoms, headaches or visual changes.  Has some right rib pain with movement, denies SOA  Objective: Vital signs in last 24 hours: Temp:  [98.3 F (36.8 C)-98.7 F (37.1 C)] 98.7 F (37.1 C) (03/19 0505) Pulse Rate:  [55-69] 69 (03/19 0505) Resp:  [18-20] 18 (03/19 0505) BP: (156-185)/(76-105) 157/95 mmHg (03/19 0505) SpO2:  [96 %-100 %] 100 % (03/19 0505) Last BM Date: 09/11/12     Physical Exam General: alert and NAD Resp: coarse breath sounds on the right, good effort Cardio: regular rate and rhythm GI: normal findings: +bs, soft, non tender Neuro: awake, no focal neurological changes wounds C/D/I with staples and sutures   Assessment/Plan: Fall  Open depressed skull fx s/p elevation, POD#5- per Dr. Gerlene Fee TBI w/SAH, ICC -- Stable Multiple right rib fxs -- Pain controlled and pulmonary toilet  FEN -- No issues  VTE -- SCD's  Dispo -- CIR hopefully next day or two, still awaiting approval and bed    WHITELanora Scott  General Trauma PA Pager: [phone]   09/12/2012    Awaiting approval for Rehab.  This patient has been seen and I agree with the findings and treatment plan.  Frank Scott. Gae Bon, MD, FACS [phone] (pager) [phone] (direct pager) Trauma Surgeon
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
## 2012-09-13 NOTE — Progress Notes (Signed)
Patient ID: Frank Scott, male   DOB: May 30, 1951, 62 y.o.   MRN: [id number]   LOS: 6 days   Subjective: Denies changes overnight, denies neuro symptoms, headaches or visual changes.  Has some right rib pain with movement, denies SOA.  Pt reports that is believes he has gotten approval for CIR now  Objective: Vital signs in last 24 hours: Temp:  [98.2 F (36.8 C)-98.5 F (36.9 C)] 98.4 F (36.9 C) (03/20 0535) Pulse Rate:  [62-73] 63 (03/20 0535) Resp:  [16-20] 16 (03/20 0535) BP: (149-164)/(80-87) 153/87 mmHg (03/20 0535) SpO2:  [96 %-97 %] 97 % (03/20 0535) Last BM Date: 09/12/12     Physical Exam General: alert and NAD Resp: coarse breath sounds on the right, good effort Cardio: regular rate and rhythm GI: normal findings: +bs, soft, non tender Neuro: awake, no focal neurological changes wounds C/I with staples and sutures, there is some serous oozing from the sutures   Assessment/Plan: Fall  Open depressed skull fx s/p elevation, POD#6- per Dr. Gerlene Fee TBI w/SAH, ICC -- Stable Multiple right rib fxs -- Pain controlled and pulmonary toilet  FEN -- No issues  VTE -- SCD's  Dispo -- CIR hopefully today    Doristine Mango  General Trauma PA Pager: [phone]   09/13/2012

## 2012-09-13 NOTE — Progress Notes (Signed)
Gladies Sofranko, PTA [phone] 09/13/2012   

## 2012-09-13 NOTE — Progress Notes (Signed)
Physical Therapy Treatment Patient Details Name: Frank Scott MRN: [id number] DOB: Mar 12, 1951 Today's Date: 09/13/2012 Time: 4098-1191 PT Time Calculation (min): 24 min  PT Assessment / Plan / Recommendation Comments on Treatment Session  Pt continues to progress with activity. Still with some deficits in gait and awareness. Pt often slamming walker wheels into obstacles rather then navigating around.  Will continue to see and progress activity as tolerated.    Follow Up Recommendations  CIR     Does the patient have the potential to tolerate intense rehabilitation     Barriers to Discharge        Equipment Recommendations  Rolling walker with 5" wheels    Recommendations for Other Services    Frequency Min 5X/week   Plan Discharge plan remains appropriate;Frequency remains appropriate    Precautions / Restrictions Precautions Precautions: Fall Precaution Comments: cognitive deficits Restrictions Weight Bearing Restrictions: No   Pertinent Vitals/Pain Mild headache reported; soreness on right side    Mobility  Bed Mobility Bed Mobility: Not assessed Transfers Transfers: Sit to Stand;Stand to Sit Sit to Stand: 4: Min guard Stand to Sit: 4: Min guard;With upper extremity assist;To chair/3-in-1 Details for Transfer Assistance: VC's for hand placement with use of rw for safety Ambulation/Gait Ambulation/Gait Assistance: 4: Min assist Ambulation Distance (Feet): 200 Feet Assistive device: Rolling walker (attempted minimal HHA gait; pt more stable with rw) Ambulation/Gait Assistance Details: continues with mild left lateral lean and crossover on turns Gait Pattern: Step-through pattern Gait velocity: decreased General Gait Details: Pt with more normal gait pattern and velocity this session.  Stairs: No      PT Goals Acute Rehab PT Goals Time For Goal Achievement: 09/22/12 Pt will go Sit to Stand: with supervision PT Goal: Sit to Stand - Progress: Progressing  toward goal Pt will go Stand to Sit: with supervision PT Goal: Stand to Sit - Progress: Progressing toward goal Pt will Ambulate: >150 feet;with supervision;with least restrictive assistive device PT Goal: Ambulate - Progress: Progressing toward goal  Visit Information  Last PT Received On: 09/13/12 Assistance Needed: +1    Subjective Data  Subjective: Pt continues to complain of soreness on his right side   Cognition  Cognition Overall Cognitive Status: Impaired Area of Impairment: Attention;Memory;Safety/judgement Arousal/Alertness: Awake/alert Orientation Level: Oriented X4 / Intact Behavior During Session: Flat affect Current Attention Level: Sustained Attention - Other Comments: Pt does not follow 2 step instructions such as walk to landmark and stop Memory Deficits: able to recall 1 of 4 exercises performed during this session. Safety/Judgement: Decreased awareness of safety precautions;Decreased awareness of need for assistance Safety/Judgement - Other Comments: Pt felt he could walk without (A) even after left lateral lean     Balance  Balance Balance Assessed: Yes Static Standing Balance Static Standing - Balance Support: No upper extremity supported;Bilateral upper extremity supported Static Standing - Level of Assistance: 4: Min assist Static Standing - Comment/# of Minutes: 3 minutes  Dynamic Standing Balance Dynamic Standing - Balance Support: No upper extremity supported Dynamic Standing - Level of Assistance: 4: Min assist Dynamic Standing - Balance Activities: Reaching for objects;Reaching across midline  End of Session PT - End of Session Equipment Utilized During Treatment: Gait belt Activity Tolerance: Patient tolerated treatment well Patient left: in chair;with call bell/phone within reach;with family/visitor present Nurse Communication: Mobility status   GP     Fabio Asa 09/13/2012, 4:24 PM Charlotte Crumb, PT DPT  [phone]

## 2012-09-13 NOTE — Progress Notes (Signed)
Stable await placement
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
## 2012-09-13 NOTE — Progress Notes (Signed)
Rehab admissions - I spoke with patient and his daughter.  He is making good progress.  He is ambulating min Assist 250 ft.  He has been up walking to bathroom with supervision.  He feels he could potentially go home with [facility] or outpatient therapies.  He will have his wife at home as she works from home.  I have asked Dr. Riley Kill to review progress and advise me.  Call me for questions.  #[phone]

## 2012-09-14 MED ORDER — DSS 100 MG PO CAPS: 100.0000 mg | ORAL_CAPSULE | Freq: Two times a day (BID) | ORAL | Status: DC | PRN

## 2012-09-14 MED ORDER — POLYETHYLENE GLYCOL 3350 17 G PO PACK: 17.0000 g | PACK | Freq: Every day | ORAL | Status: DC | PRN

## 2012-09-14 MED ORDER — TRAMADOL HCL 50 MG PO TABS: 50.0000 mg | ORAL_TABLET | Freq: Four times a day (QID) | ORAL | Status: DC | PRN

## 2012-09-14 NOTE — Care Management Note (Signed)
  Page 1 of 1   09/14/2012     10:29:38 AM   CARE MANAGEMENT NOTE 09/14/2012  Patient:  Frank Scott, Frank Scott   Account Number:  [id number]  Date Initiated:  09/10/2012  Documentation initiated by:  Carlyle Lipa  Subjective/Objective Assessment:   fall 78ft when scaffolding collapsed; depressed skull fx requiring OR to repair     Action/Plan:   CIR when stable   Anticipated DC Date:  09/11/2012   Anticipated DC Plan:  IP REHAB FACILITY         Choice offered to / List presented to:             Status of service:  In process, will continue to follow Medicare Important Message given?   (If response is "NO", the following Medicare IM given date fields will be blank) Date Medicare IM given:   Date Additional Medicare IM given:    Discharge Disposition:    Per UR Regulation:  Reviewed for med. necessity/level of care/duration of stay  If discussed at Long Length of Stay Meetings, dates discussed:    Comments:  09-14-12 Bonner Puna from [place] called needing orders for DME faxed to her , same done , DME will be delivered to patient's hospital room today between 1100 and 1130 .  Phone [phone]  fax [phone]  Ronny Flurry RN BSN [phone]   09-14-12 Spoke to Harriett Sine with [employer], faxed orders for HHPT / OT , shower chair , rolling walker fax number [phone] , phone [phone]  Ronny Flurry RN BSN [phone]

## 2012-09-14 NOTE — Discharge Instructions (Signed)
Brain Injuries and Mass Trauma Events Brain injuries can occur during damage caused by an accident (mass trauma event). Examples of these events include auto or industrial accidents, and falls from a home roof or ladder. If you think you, or someone you know, has a brain injury, seek medical attention. CAUSES  Brain injuries are caused by a blow or jolt to the head that can disrupt the normal function of the brain. These injuries can range from mild to severe. Mild brain injuries are called concussions. They are usually not life-threatening. But sometimes, even mild brain injuries can cause serious, long-lasting problems. SYMPTOMS  The signs of a brain injury may be slight. Patients, family members, and caregivers may miss these problems. People with a brain injury may look fine, even though they are acting or feeling differently. The most common signs include:  Problems with thinking, remembering, speaking, or reading.  Difficulty paying attention or concentrating.  Confusion, getting lost (disorientation).  Mood changes (feeling sad or angry, for no reason).  Being easily irritated.  Depression.  Changes in sleeping pattern.  Headaches that will not go away.  Loss of energy and motivation (feeling tired).  Neck pain.  Blurred vision.  Dizziness. TREATMENT   Your caregiver may refer you to specialists. These include:  Neurologists.  Neuropsychologists.  Neurosurgeons.  Specialists in rehabilitation. One example is a Doctor, general practice. This is a specialist who tests and treats speech, language, and swallowing problems.  Getting help from trained specialists, soon after the injury, may speed your recovery. Document Released: 12/18/2002 Document Revised: 09/05/2011 Document Reviewed: 01/30/2008 [facility] Patient Information 2013 [place], [place].  Acute Brain Injury, Guide for Family This is basic information about brain injury and its treatment. Please read it at your own  pace. As you learn more about the brain, you will have many questions. Members of the health care team will do their best to answer your questions. Definite answers may not be known because the long term effects of brain injury can be difficult to predict. HOW DOES THE BRAIN WORK? The brain controls the actions of the body and allows [place] to think, learn, and remember. The brain has three main parts which are the:  Cerebral hemispheres, left and right which are divided into lobes.  Cerebellum.  Brain stem. Each section of the brain has special jobs to do and sections of the brain also work together. The left cerebral hemisphere controls the right side of the body and is usually responsible for speech. The right cerebral hemisphere controls the left side of the body and is usually responsible for creative thinking.  PROTECTION AND OXYGEN FOR THE BRAIN The brain controls many important functions. It needs good protection and blood supply. This is done in the following ways:  Skull: A hard bone that surrounds the brain tissue.  Dura: A tough covering around the brain tissue and the spinal cord.  Cerebrospinal Fluid (CSF) or Spinal Fluid: Fluid that flows through the ventricles and around the brain and spinal cord. The ventricles are spaces inside the brain. The cerebrospinal fluid acts like a "shock absorber" for the brain.  Blood: Provides oxygen and food for the brain. WHAT TYPES OF BRAIN INJURIES MAY OCCUR? Even though the brain is well protected, it may be injured. Every injury is different. Most injuries are a result of bruising, bleeding, twisting, or tearing of brain tissue. Damage to the brain may occur at the time of injury. It may also develop after the injury due to  swelling or further bleeding. Some types of brain injury are:  Skull Fracture: a break in the bone that surrounds the brain. These fractures often heal on their own. Surgery may be needed if there has been damage to the brain  tissue below the fracture.  Contusion/Concussion: a mild injury or bruise to the brain which may cause a short loss of consciousness. It may cause headaches, nausea, vomiting, dizziness, and problems with memory and concentration. This injury will not need surgery.  Coup/Contre-Coup: A [place] word that describes contusions that occur at two sites in the brain. When the head is hit, the impact causes the brain to bump the opposite side of the skull. Damage occurs at the site of impact and on the opposite side of the brain.  Epidural Hematoma: a blood clot that forms between the skull and the top lining of the brain (dura). This blood clot can cause fast changes in the pressure inside the brain. Emergency surgery may be needed. The size of the clot will determine if surgery is needed.  Subdural Hematoma: a blood clot that forms between the dura and the brain tissue. If this bleeding occurs quickly it is called an acute subdural hematoma. If it occurs slowly over several weeks, it is called a chronic subdural hematoma. The clot may cause increased pressure and may need to be removed surgically.  Intracerebral Hemorrhage: A blood clot deep in the middle of the brain that is hard to remove. Pressure from this clot may cause damage to the brain. Surgery may be needed to relieve the pressure.  Diffuse Axonal Injury (DAI): Damage to the pathways (axons) that connect the different areas of the brain. This occurs when there is twisting and turning of the brain tissue at the time of injury. The brain messages are slowed or lost. Treatment is aimed at managing swelling in the brain because torn axons can not be repaired.  Anoxic Brain Injury: An injury that results from a lack of oxygen to the brain. This is most often from a lack of blood flow due to injury or bleeding. WHAT HAPPENS WHEN THE BRAIN IS INJURED? Damage to the brain may occur immediately, as a result of the injury, or it may develop as a result of  swelling or bleeding that follows the injury. BRAIN WITH EDEMA: Changes can result from brain injury. The brain tissue may swell causing it to take up more room in the skull. This is called edema. When this occurs, the swollen brain tissue will push the other contents of the skull to the side. BRAIN WITH A HEMATOMA: There may be bruising called contusions or a collection of blood called a hematoma or clot. This may also push the other contents to one side. BRAIN WITH HYDROCEPHALUS: The flow of CSF may also become blocked. This will cause the open spaces (ventricles) to become enlarged. This is called hydrocephalus. Any of these changes can cause increased intracranial pressure. WHAT IS INTRACRANIAL PRESSURE? To understand intracranial pressure, think of the skull as a rigid box. After brain injury, the skull may become overfilled with swollen brain tissue, blood, or CSF. The skull will not stretch like skin to deal with swelling. The skull may become too full and increase the pressure on the brain tissue. This is called increased intracranial pressure.  WHAT IS CEREBRAL PERFUSION PRESSURE? Blood flow to the brain is called cerebral perfusion pressure. Blood pressure and intracranial pressure affect the cerebral perfusion pressure. If the blood pressure is low and/or  the intracranial pressure is high, the blood flow to the brain may be limited. This causes decreased cerebral perfusion pressure.  HOW WILL THE PATIENT RESPOND TO A BRAIN INJURY? The patient's responses may vary depending on the type of injury or pressure changes in the brain. Possible responses include:   Agitation.  Confusion.  Patients may also have problems with speech, vision, or muscle weakness in their arms or legs.  Decreased responses.  Coma. A state of unresponsiveness when patients do not speak or follow commands, and are unaware of their surroundings. The length of time a patient remains in a coma varies. HOW ARE BRAIN  INJURIES EVALUATED? Patients with brain injury require frequent assessments and diagnostic tests. These include:   Neurological Exam: A series of questions and simple commands to see if the patient can open their eyes, move, speak, and understand what is going on around them. For example: What is your name? Where are you? What day is it? Wiggle your toes. Hold up two fingers. A standard way to describe patient responses may be used. Most hospitals use the Glasgow Coma Scale or [place] Levels of Cognitive Functioning. You can read about these scales and what the scores mean in the Appendix.  X-ray: A picture that looks at bones to see if they are broken (fractured). It can also be used to take a picture of the chest to look at the lungs. This test may be done at the bedside or in the X-ray department and takes between 5-30 minutes to complete.  CT Scan (CAT Scan): An X-ray that takes pictures of the brain or other parts of the body. The scan is painless but the patient must lie very still. The test takes 15-30 minutes to complete.  MRI (Magnetic Resonance Imaging) Scan: A large magnet and radio waves are used, instead of X-rays, to take pictures of the body's tissues. It is painless but noisy. The machine is shaped like a long tube. The patient must lie still on a flat table in the middle of the machine. The test takes about 60 minutes to complete.  Angiogram: A test to look at the blood vessels in the brain. Using a catheter, or small flexible tube, dye is put into an artery (usually in the groin) that supplies blood to the brain. This test can tell if the blood vessels have been damaged or are spasming. The test takes 1-3 hours.  ICP Monitor: A small tube placed into or just on top of the brain through a small hole in the skull. This will measure the pressure inside the brain (intracranial pressure).  EEG (Electroencephalograph): A test to measure electrical activity in the brain. Special patches  called electrodes are applied to the head to measure the activity. The test is painless and can be done at the bedside or in the EEG department. The length of the test varies. HOW ARE BRAIN INJURIES TREATED? The goals of brain injury treatment are to:   Stop any bleeding.  Prevent an increase in pressure within the skull.  Control the amount of pressure, when it does increase.  Maintain adequate blood flow to the brain.  Remove any large blood clots. TREATMENT  Treatments will vary with the type of injury. Your caregiver will decide which ones are used. Some common treatments are:  Positioning: Usually the head of the bed will be elevated slightly and the neck kept straight. This position may decrease the intracranial pressure by allowing blood and CSF to drain from  the brain. Please do not change the position of the bed without asking the nurse.  Fluid Restriction: It may be necessary to limit the fluids that a patient receives. The brain is like a sponge. It swells with extra fluid. Limiting fluids can help control the swelling. Please do not give fluids without asking the nurse.  Ventricular Drain (Ventriculostomy): A small tube is placed in the ventricle. It measures and controls pressure inside the skull. It can be used to drain some CSF (cerebrospinal fluid) from the brain.  Ventilator: A machine used to support the patient in their own breathing, or give the patient breaths. When the ventilator gives extra breaths, the blood vessels in the brain become smaller. This may help control the intracranial pressure.  Surgery: There are three types of surgery used with brain injury:  Craniotomy - The skull is opened to relieve the causes of increased pressure inside the skull. Causes may be fractured bones, blood clots, or swollen brain tissue.  Burr holes - A small opening is made into the skull to remove blood clots.  Bone flap removal - A piece of bone is removed from the skull to  relieve pressure caused by swollen brain tissue. MEDICATION There are several types of medications used to treat brain injury. Some of these include:  Diuretics are used to decrease the amount of water in the patient's body. This makes less water available to the brain for swelling.  Anticonvulsants are used to prevent seizures. Seizures occur as a result of extra electrical activity in the brain. There are several types of seizures. The most common type causes the patient to have jerking movements of the arms and legs followed by sleep. Other types may cause slight tremors of the face, or staring spells. Please notify the nurse or doctor if you see any of these signs. Some patients have a seizure at the time of injury while others may develop seizures after the injury.  Sedatives are given if the patient's intracranial pressure is very high and hard to control. This medicine puts the patient into a deep "sleep". This may help prevent more swelling and damage. WHAT EQUIPMENT WILL YOU SEE WHEN YOU VISIT? Depending on the type of brain injury, different kinds of equipment will be used. Ask a member of the health care team if you have any questions about equipment.   Monitor: A machine that shows heart rate, breathing, blood pressure, intracranial pressure, and cerebral perfusion pressure.  Head Dressing: A bandage around the head used to keep the wound or incision clean and dry.  ICP Monitor: A small tube placed into or just on top of the brain through a small hole in the skull. This will measure the amount of pressure inside the brain (intracranial pressure).  Nasogastric Tube (NG): A tube placed through the nose into the stomach that can be used to suction the stomach or provide liquid formula directly into the stomach.  Endotracheal Tube: A tube inserted through the patient's nose or mouth into the trachea (windpipe) to help with breathing and suctioning.  EKG Lead Wires: Wires connected to  the chest with small patches that measure the heart rate and rhythm.  Intravenous Catheter (IV) and Intravenous Fluid: A flexible catheter which allows fluid, nutrients, and medicine to be given directly into a vein.  Ventilator: A machine used in the Intensive Care Unit to support the patient in their own breathing or give the patient breaths.  Anti-Embolism Stockings (Frequently call TEDS): Long Chery Giusto  stockings used to help prevent the pooling of blood in the legs.  Sequential Compression Stockings (Frequently called Kendall's): Plastic leg wraps that help prevent blood clots by inflating and deflating around the legs.  Urinary "Foley" Catheter: A tube inserted into the bladder to drain and allow for accurate measurement of urine. WHAT OTHER TREATMENTS MAY BE USED?  Antibiotics: Antibiotics are used to prevent and treat infections that occur. It is not unusual for people with brain injuries to get infections. They may get pneumonia, bladder infections, blood infections, or infections in the brain or cerebrospinal fluid called meningitis.  Chest PT and Suctioning: To prevent or treat pneumonia, staff may use a vibrating machine or may clap on the patient's chest. This loosens the phlegm in the lungs. Then the patient will be asked to cough. If the patient is not able to cough up the phlegm they must be suctioned. When a patient is suctioned a catheter is placed in the back of the throat or into the lungs.  Tracheostomy Janina Mayo): If the patient has a lot of lung secretions or is on a ventilator for a long time they may need a trach. A trach is a tube placed in the trachea (windpipe). It will make it easier for the patient to cough up phlegm. It also allows the nurse to suction the lungs. Initially the patient will be unable to talk while the trach is in place. As the patient improves, a talking trach may be used. A trach is usually not permanent.  Suctioning of the Stomach: Sometimes after brain  injury, the stomach will stop working for a short time. This is called an ileus. Even though the stomach may not be working it continues to make acid. The acid may damage the stomach lining and cause stomach ulcers if they are not removed. A nasogastric tube (NG) will be placed through the nose into the stomach. This tube will be used to help remove stomach secretions. Medications may also be given to help prevent stomach ulcers.  Nutrition: Meeting nutrition and fluid needs is important after brain injury. Patients may be less active, yet have very high nutritional needs. At first, nutrition may be supplied by an IV. When the stomach starts working, an evaluation of chewing and swallowing safety will be completed. If the patient is too sleepy to eat, or unable to swallow, a small nasogastric feeding tube may be used for nutrition. The tube is placed through the nose into the stomach. Liquid formula will be given through the feeding tube. Feedings may be given continuously or several times a day. The dietician will assist with food and fluid selection. Milkshakes and liquid formulas may also be used to provide extra calories and high protein nutrition. A feeding tube may be used if the patient continues to be too sleepy to eat or unable to swallow. A gastrostomy tube is a feeding tube that goes in the stomach. A jejunostomy tube is a feeding tube that goes in the intestine.  Bowel and Bladder Care: Patients may not have control of their bowel or bladder. Catheters or diapers will be used until bowel and bladder control returns.  Skin Care: Some things that help prevent bedsores include turning the patient, padding equipment, keeping skin clean and dry, using special mattresses, and making sure the patient gets enough calories.  Range of Motion (ROM) and Splints: Patients with brain injury may not be able to move their joints as much as needed. This can cause tight muscles and  joints called contractures.  Range of motion (ROM) exercises and special splints for hands and feet help prevent contractures.  Pain Control: Comfort measures and medication will be used for pain control. However, medications may be limited to types that do not cause drowsiness. WHO WILL HELP AFTER BRAIN INJURY? Members of the health care team will work together with the patient, family, and friends during the hospital stay. Care will be centered on the individual needs of the patient. Family and friends are important members of the team.   Patient: The patient is the most important member of the team. Care will be planned based on how the patient responds to treatment.  Family and Friends: You provide emotional support to the patient. Family and friends also provide the health care team with important facts about the patient's past history and can help watch for changes. Other team members will show you what you can do to help with the recovery process.  Doctors: Neurosurgery doctors are specialists who help determine the type of brain injury and its treatment. They may perform surgery on the brain. They will work with other doctors if the patient is in intensive care or has injuries to other parts of the body.  Nurses: Nurses check patient's vitals (temperature, blood pressure, heart and breathing rate) and watch for changes in strength and thinking. They help with daily cares such as eating and bathing. Nurses also coordinate care among the members of the health care team.  Social Workers: Social workers provide emotional support to help the patient and family adjust to being in the hospital. They coordinate discharge planning, referral to community resources, and answer questions about insurance or disability.  Physical Therapists (PT): Physical therapists evaluate and treat weaknesses in the patient's strength, flexibility, balance, rolling, sitting, standing and walking. Treatment may include exercises or instruction in  use of equipment such as walkers, canes, or wheelchairs.  Occupational Therapists (OT): Occupational therapists evaluate the patient's ability to perform dressing, bathing, homemaking and activities that require memory and organization. They provide treatment or equipment needed for safe independent living.  Speech Therapists: Speech therapists test and treat speech, language, thinking and swallowing problems.  Neuropsychologists: Neuropsychologists test thinking, memory, judgment, emotions, behavior and personality. This information can be used to help guide treatment. It will also help determine the amount of supervision that the patient needs when they leave the hospital.  Dieticians: Dieticians assess nutritional needs. They work with the patient and other team members to help the patient meet their nutritional goals.  Other staff members may work with the patient and family. These include:  Respiratory therapists.  Activity Therapists.  Clergy.  Child Life Therapists.  Patient Representatives.  Vocational Counselors.  Music Therapists.  Recreation therapists. HOW WILL YOU REACT? When a friend or family member is hospitalized with a brain injury, it is normal for you to have many different emotions. These emotions will come and go at different times.   Panic and Fear: Panic and fear are common reactions after a family member has a brain injury. Fears are intense because you are worried the patient may not survive. Until the patient becomes medically stable, your physical and emotional signs of panic may continue. These symptoms include rapid breathing, inability to sleep, decreased appetite and upset stomach. Some people may cry uncontrollably.  Shock and Denial: You may feel that what is happening is not real. You may notice things going on around you, but have trouble remembering information and conversations or meetings  with others. You may also have a hard time understanding  the seriousness of the injury that has occurred.  Anger: Many people feel angry that they or their loved ones are in this situation. This may be justified. You may be angry with the patient for putting themselves in a situation where they could be hurt. You may also be angry with family members, friends, or others involved in the accident. You may be upset with the health care team for not doing or saying what you think is right. These are all normal reactions.  Guilt: Guilt is also a normal reaction. You may feel you could have done something to prevent the accident from happening, even when this may be far from true. You may also think about past events and personal experiences with the patient that you wish could have been different or better. If you are feeling angry with the patient, you may also feel guilty about your anger. We encourage you to talk about your feelings with someone close to you or a professional staff member.  Isolation: During this time you may feel distant from others. In this strange situation, you may have a hard time relating to others. You may think that others will not understand. You may isolate yourself because you think others are scared or disapprove of your feelings. However, a crisis such as a brain injury is a time when it is helpful to accept comfort, support and assistance from others.  Hope: As the patient begins to stabilize, your anxiety about survival will be combined with hope of recovery. Medical complications and slow recovery may increase anxiety. However, hope may be brought about by the smallest changes. Any of these emotions are normal reactions to a very stressful situation. You may find it helpful to discuss your feelings with friends, family, or the health care team. It may also be useful to write about your feelings and experiences in a daily journal.  WHAT ARE SUGGESTIONS FOR COPING? People cope with stressful situations in different ways. What works  for one person may not be helpful to another. We hope some of these suggestions will help you get through this difficult time.   Write important information down in a journal or notebook. You can use this to keep track of questions you want to ask members of the health care team. It may also be useful to share with the patient.  Establish a "phone tree." Name one person for family and friends to call for information on the patient's condition.  Rotate family visits. If you need or want to leave the hospital, you may want to have someone stay with the patient so you can feel reassured the patient is not alone.  When someone offers to help, accept the offer. Try to be specific about how this person can help.  Express your feelings. Discuss your positive and negative feelings with family members, friends, and staff.  Be kind to yourself. Take time for a walk or have a meal with a friend. Also, try to leave the hospital for a meal or restful night of sleep. It is very important to take care of yourself. By taking care of your own needs, you will be more prepared to make good decisions and support your loved one. WHAT ARE THE SIGNS OF IMPROVEMENT? "How long will it take for my loved one to get better?" "What will he or she be like?" Your health care team may have a hard time answering these questions.  Age, extent of damage, length of time since injury, and past mental and physical health of the patient are factors used when predicting the extent of recovery.  Most patients follow a general pattern of recovery after a severe brain injury. This pattern is divided into stages. It is important to know each patient is different and may not follow the stages exactly. Patients vary in the amount of time spent at each stage and their recovery may stop at any stage. Recovery may be grouped into the following four stages: Stage 1: Unresponsiveness: During this stage the patient does not respond consistently or  appropriately. You may hear this stage referred to as a coma. You may notice different movements in the patient. These are referred to as reflexive or generalized responses.  Decerebrate: A reflex that causes straightening of the arms and legs.  Decorticate: A reflex that causes bending of the arms and straightening of the legs.  Generalized responses: Random movement of the arms and legs for no specific reason. Stage 2: Early Responses: During this stage the patient starts to respond to things that are happening to them. The responses will be more appropriate, but may be inconsistent or slow. The patient will start to have localized responses and follow simple commands. Some examples of early responses to watch for are: Localized response: These are appropriate movements by the patient in response to sound, touch, or sight. Turning toward a sound, pulling away from something uncomfortable, or following movement with the eyes are examples.  Following simple commands: Opening and closing eyes, sticking the tongue out, or gripping and releasing hands when asked are examples. Stage 3. Agitated and Confused: At this stage the patient is responding more consistently. The patient will be confused about where he or she is and what has happened. The patient will have difficulty with memory and behavior. The patient's confusion may lead to yelling, swearing, biting, or striking out. Do not be alarmed if soft wrist and ankle ties are used to protect the patient and prevent tubes from being pulled out. It is very important to remember this stage is a step toward recovery and this behavior is not intentional. Stage 4. Higher Level Responses: The patient completes routine tasks without difficulty, but still needs help with problem solving, making judgments and decisions. The patient may not understand his or her limitations and safety is a big concern. Unusual or high stress situations make activities more difficult.  The patient may seem more like the person you knew before. However, there may be personality changes. Unfortunately, there is no way to predict how long a person will remain in one stage or what the final outcome will be. The team will work during the hospital stay to achieve the best possible outcome.  HOW CAN YOU HELP WITH RECOVERY? The family and friends of a person with a brain injury are important members of the team. Your knowledge about the patient's emotional and physical needs is valuable, and so is your participation in helping take care of these needs. The following are suggestions for things you can do that correspond with the stages of recovery. Stage 1. Unresponsive Stage  At this stage the patient appears to be in a deep sleep and does not respond to their surroundings. The goal is to obtain a response to touch, sound, sight or smell.  When speaking to the patient, assume he or she understands what you are saying. Speak in a comforting, positive and familiar way.  Speak clearly  and slowly about familiar people and memories.  When visitors are present, focus on the patient. Keep the number of visitors to 1 or 2 people at a time. Visits should be short. Other distractions (TV, radio) should be turned off when visiting.  Provide the patient with pictures, music, and personal items that are comforting and familiar. Use poster board or a bulletin board near the bed.  The nurses and therapists may encourage you to assist in care of the patient. You may be asked to help with hair care, shaving, applying skin lotion or gently stretching and positioning the patient's arms and legs. If you do not feel comfortable with these activities, that is okay. The staff will understand. Stage 2. Early Responses  At this stage the patient is beginning to respond to people and hospital surroundings. The responses may range from turning toward a familiar voice to moving an arm or leg on request.  The  goal is to increase the consistency of responses.  There may be a delayed response time when asking the patient to move, speak, or pay attention. Always wait 1-2 minutes for the requested response. Repeat your request only a couple of times during this time period.  Be aware that the patient's attention span may only be 5-10 minutes before fatigue and frustration set in.  Allow for rest periods. Turn off the TV, music, and lights, and limit visitors. The patient can become stressed by too much noise, light or stimulation.  Continue with suggestions listed in the "unresponsive" stage. Stage 3. Agitated and Confused Responses  During this stage, things are confusing. The patient may begin to remember past events but may be unsure of surroundings and the reason for hospitalization. The goal is to help the patient become oriented and to continue to treat his or her physical needs. Provide one activity at a time and expect the patient to pay attention for only short periods. Keeping the noise level low helps the patient focus.  The patient may repeat a word, phrase, or activity over and over. Try to interest the patient in a different activity.  The patient may do socially unacceptable things during this time, such as swearing or hitting. This is common. Calmly tell the patient the behavior is not appropriate.  Help orient the patient to his or her surroundings with both visual and verbal information (such as the suggestions below). Remembering information from one time to another is difficult.  A calendar with the days marked off.  A sign in the room telling them where they are.  A posted schedule with meal times, therapies, and special appointments.  To decrease frustration, allow the patient to move about with supervision. Stage 4. Higher Level Responses  At this stage the patient is able to take part in daily routines with help for problem solving, making judgments, and decisions. Most of  the suggestions from the previous stage continue to apply here. The goal is to decrease the amount of supervision needed and increase independence.  Help make the environment safe. Safety decisions may still be difficult for the patient to make. Praise safe decisions and give calm explanations about unsafe decisions. Learning is still difficult.  Encourage the use of memory aids, such as a date book, to help with appointments and daily routines.  Encourage brief rest periods because the patient will continue to need more rest.  Check with the health care team on activities that may be completed with or without supervision. These activities may  include work or school re-entry, taking medications, driving, or managing money. WHAT HAPPENS AFTER ACUTE CARE?  Discharge Planning: Early in the hospital stay, the social worker will meet with the patient and family to start discharge planning. Many patients will need care or therapy after they leave acute care. Some patients will be discharged to a nursing facility, while others will be discharged to their homes.  Discharge to a Nursing Facility: There are several types of nursing facilities.  Skilled care provides extensive nursing care and daily therapy. Many patients will start with skilled care and then move to acute rehabilitation care, intermediate care, or residential care.  Acute rehabilitation provides an inpatient program of intense therapy in a hospital. The patient will need to actively participate in 3 to 6 hours of therapy per day (i.e. physical, occupational, speech, and activity therapy).  Intermediate care provides less extensive nursing care and therapy. Residential care is for patients who are fairly independent and do not need routine nursing care or therapies.  Discharge to Home: If the patient goes home, they may still need therapy or other care. Some of the options are:  Outpatient therapy is provided at hospitals, clinics and  some nursing homes. Outpatient physical therapy will help build up strength and endurance. Outpatient occupational, speech and cognitive therapy may also be needed. Family or friends may need to arrange transportation for therapy appointments.  Home health care programs are available in many communities. Some of the services they offer include in-home nursing care, homemaker and health aides, meals-on-wheels, adult day care, home therapy visits, medical equipment rental/purchase, and transportation. MAKING THE CHOICE During the acute care hospital stay, the health care team will make recommendations about the level of care the patient will need at discharge. Then the patient and family will choose the agency that provides the services. Your choice may be based on insurance coverage, location, the patient's potential for recovery, and the patient's or family's feelings about the services provided. Some facilities will come to the hospital to see the patient and their medical record before they decide to accept the patient. If accepted, the date for transfer or discharge will be set.  Document Released: 06/13/2005 Document Revised: 09/05/2011 Document Reviewed: 07/10/2006 [facility] Patient Information 2013 [place], [place].

## 2012-09-14 NOTE — Discharge Summary (Signed)
Physician Discharge Summary  Patient ID: Frank Scott MRN: [id number] DOB/AGE: 01-Jan-1951 62 y.o.  Admit date: 09/07/2012 Discharge date: 09/14/2012  Admitting Diagnosis: Fall See below  Discharge Diagnosis Patient Active Problem List   Diagnosis Date Noted  . Fall 09/07/2012  . Open depressed fracture of skull 09/07/2012  . Traumatic subarachnoid hemorrhage 09/07/2012  . Traumatic intracerebral hemorrhage 09/07/2012  . Multiple fractures of ribs of right side 09/07/2012  . OBESITY 11/02/2009  . ELEVATED BLOOD PRESSURE WITHOUT DIAGNOSIS OF HYPERTENSION 11/02/2009    Consultants Dr. Aliene Beams, neurosurgery Dr. Riley Kill, rehab medicine  Procedures Right frontotemporal craniectomy with elevation of depressed skull fracture, 09/07/12 Dr. Jaynie Bream Course:  62 y.o. male who was working on a scaffolding at [employer] when it broke and he fell about 20 feet. No recollection of fall but with +LOC and confusion at scene. He was admitted for work up 09/07/12 and was noted to have open depressed right frontal bone skull Fx with parenchymal contusion 3 X 1.5 cm and small amount of SAH, 4th- 9th right rib fractures, bilateral pulmonary contusions and sequelae of chronic L-femoral DVT. He was evaluated by Dr. Gerlene Fee and taken to OR for R-frontotemporal craniectomy with elevation of skull Fx the same day.  Follow up head CT with interval post op changes involving subdural, SAH, IPH and contusive changes in right frontal lobe. PT/OT/ST evaluations done on 03/15-patient noted to be confused with delayed processing with lethargy and has moderate cognitive deficits. Continued with PT/OT and actually improved nicely and was felt appropriate to be discharged home with home health PT.  This was discussed with his family who felt they could supply 24 hr supervision for patient.  At discharge, his VSS, he incisons were C/d/i with staples and sutures, he was mobilizing well with walker, mental status was  appropriate and he felt able to go home.  Physical Exam at discharge General: awake, alert, NAD HEENT: normocephalic, staples and sutures on the left forehead/head, small amount of serous drainage from the sutured incision Heart: RRR Lungs: CTA bilateral Ext: warm and well perfused Neuro: CN 3-12 grossly intact      Medication List    TAKE these medications       DSS 100 MG Caps  Take 100 mg by mouth 2 (two) times daily as needed for constipation.     polyethylene glycol packet  Commonly known as:  MIRALAX / GLYCOLAX  Take 17 g by mouth daily as needed.     traMADol 50 MG tablet  Commonly known as:  ULTRAM  Take 1-2 tablets (50-100 mg total) by mouth every 6 (six) hours as needed.         Follow-up Information   Follow up with Reinaldo Meeker, MD.   Contact information:   [address] [place] [address] [phone]       Call [facility]. (As needed)    Contact information:   [address] [place] [address] [phone]       Signed: Denny Levy [facility] [phone]  09/14/2012, 7:41 AM

## 2012-09-14 NOTE — Progress Notes (Signed)
DC HOME WITH WIFE, WALKER AND SHOWER CHAIR SENT HOME WITH PT, WIFE TO CALL OFFICE WHEN SHE GETS HOME TO MAKE APPT WITH NEURO. VERBALLY UNDERSTOOD DC INSTRUCTIONS, NO QUESTIONS ASK,

## 2012-09-15 ENCOUNTER — Emergency Department (HOSPITAL_COMMUNITY): Payer: Worker's Compensation

## 2012-09-15 ENCOUNTER — Encounter (HOSPITAL_COMMUNITY): Payer: Self-pay | Admitting: Emergency Medicine

## 2012-09-15 ENCOUNTER — Emergency Department (HOSPITAL_COMMUNITY)
Admission: EM | Admit: 2012-09-15 | Discharge: 2012-09-15 | Disposition: A | Discharge status: Home / Self Care | Payer: Worker's Compensation | Attending: Emergency Medicine | Admitting: Emergency Medicine

## 2012-09-15 DIAGNOSIS — Z86718 Personal history of other venous thrombosis and embolism: Secondary | ICD-10-CM | POA: Insufficient documentation

## 2012-09-15 DIAGNOSIS — Z87828 Personal history of other (healed) physical injury and trauma: Secondary | ICD-10-CM | POA: Insufficient documentation

## 2012-09-15 DIAGNOSIS — Z9889 Other specified postprocedural states: Secondary | ICD-10-CM | POA: Insufficient documentation

## 2012-09-15 DIAGNOSIS — R1110 Vomiting, unspecified: Secondary | ICD-10-CM | POA: Insufficient documentation

## 2012-09-15 DIAGNOSIS — I629 Nontraumatic intracranial hemorrhage, unspecified: Secondary | ICD-10-CM | POA: Insufficient documentation

## 2012-09-15 DIAGNOSIS — R030 Elevated blood-pressure reading, without diagnosis of hypertension: Secondary | ICD-10-CM | POA: Insufficient documentation

## 2012-09-15 DIAGNOSIS — Z8781 Personal history of (healed) traumatic fracture: Secondary | ICD-10-CM | POA: Insufficient documentation

## 2012-09-15 DIAGNOSIS — Z79899 Other long term (current) drug therapy: Secondary | ICD-10-CM | POA: Insufficient documentation

## 2012-09-15 MED ORDER — METOPROLOL TARTRATE 25 MG PO TABS
50.0000 mg | ORAL_TABLET | Freq: Once | ORAL | Status: AC
  Administered 2012-09-15: 50 mg via ORAL
  Filled 2012-09-15: qty 2

## 2012-09-15 MED ORDER — METOPROLOL TARTRATE 50 MG PO TABS: 50.0000 mg | ORAL_TABLET | Freq: Two times a day (BID) | ORAL | Status: DC

## 2012-09-15 NOTE — ED Notes (Signed)
Received pt from home with c/o woke up today with nausea and vomiting. Pt fell last week, had head injury. Pt presents with staples to right side of head, and blood oozing from sutured area on right side of head. Pupils pin point. Pt talking in complete sentences without difficulty. Pt BP elevated for EMS 172/100.

## 2012-09-15 NOTE — ED Provider Notes (Signed)
History     CSN: [id number]  Arrival date & time 09/15/12  1612   First MD Initiated Contact with Patient 09/15/12 1616      Chief Complaint  Patient presents with  . Nausea  . Emesis    (Consider location/radiation/quality/duration/timing/severity/associated sxs/prior treatment) The history is provided by the patient and the spouse.  Frank Scott is a 62 y.o. male here presenting with nausea vomiting. He was here a week ago after falling off of a 20 foot ladder and had a subdural hematoma with a depressed skull fracture requiring craniotomy. He was discharged yesterday. Today he had home PT and he was noted to be hypertensive 172/100 and he also vomited x 1. Denies any headaches and feels better at this morning. Denies any chest pains or shortness of breath.   Past Medical History  Diagnosis Date  . DVT of deep femoral vein     Past Surgical History  Procedure Laterality Date  . Appendectomy    . Craniotomy Right 09/07/2012    Procedure: Elevation of Decompressed Skull Grace Bushy;  Surgeon: Reinaldo Meeker, MD;  Location: [facility];  Service: Neurosurgery;  Laterality: Right;  Elevation of Right Fronto-Temporal Skull Fracture    Family History  Problem Relation Age of Onset  . Deep vein thrombosis Daughter   . Clotting disorder Daughter   . Deep vein thrombosis Son   . Diabetes Mother   . Deep vein thrombosis Sister   . Deep vein thrombosis Brother     History  Substance Use Topics  . Smoking status: Never Smoker   . Smokeless tobacco: Not on file  . Alcohol Use: Yes     Comment: rarely      Review of Systems  Gastrointestinal: Positive for vomiting.  All other systems reviewed and are negative.    Allergies  Review of patient's allergies indicates no known allergies.  Home Medications   Current Outpatient Rx  Name  Route  Sig  Dispense  Refill  . Docusate Sodium (DSS) 100 MG CAPS   Oral   Take 100 mg by mouth 2 (two) times daily as needed (for  constipation).         . polyethylene glycol (MIRALAX / GLYCOLAX) packet   Oral   Take 17 g by mouth daily as needed (for constipation).         . traMADol (ULTRAM) 50 MG tablet   Oral   Take 50-100 mg by mouth every 6 (six) hours as needed for pain.           BP 162/92  Pulse 56  Temp(Src) 98 F (36.7 C) (Oral)  Resp 12  SpO2 99%  Physical Exam  Nursing note and vitals reviewed. Constitutional: He is oriented to person, place, and time. He appears well-developed and well-nourished.  Calm, NAD   HENT:  Head: Normocephalic.  Mouth/Throat: Oropharynx is clear and moist.  R scalp staples in place, no purulent drainage   Eyes:  Pupils 2 mm but reactive bilaterally   Neck: Normal range of motion. Neck supple.  Cardiovascular: Normal rate, regular rhythm and normal heart sounds.   Pulmonary/Chest: Effort normal and breath sounds normal. No respiratory distress. He has no wheezes. He has no rales.  Abdominal: Soft. Bowel sounds are normal. He exhibits no distension. There is no tenderness. There is no rebound and no guarding.  Musculoskeletal: Normal range of motion. He exhibits no edema and no tenderness.  Neurological: He is alert and oriented to  person, place, and time. No cranial nerve deficit. Coordination normal.  Skin: Skin is warm and dry.  Psychiatric: He has a normal mood and affect. His behavior is normal. Judgment and thought content normal.    ED Course  Procedures (including critical care time)  Labs Reviewed - No data to display Ct Head Wo Contrast  09/15/2012  *RADIOLOGY REPORT*  Clinical Data: 62 year old male with headache, nausea, vomiting. Recent right frontal hematoma with surgical evacuation.  CT HEAD WITHOUT CONTRAST  Technique:  Contiguous axial images were obtained from the base of the skull through the vertex without contrast.  Comparison: 09/08/2012 and prior CTs  Findings: Increasing right frontal edema is identified with a new faint 9 x 11 mm  area of hemorrhage (image 14).  Increasing mass effect is identified with increasing right to left midline shift - now 5 mm, previously 2 mm. There is no evidence of hydrocephalus. Other smaller areas of hemorrhage within the right frontal region are relatively unchanged. Surgical fixation of the right frontal fracture is again noted. No new bony abnormalities are identified.  IMPRESSION: Increasing right frontal edema with new faint 9 x 11 mm area of hemorrhage (may be subacute) with increasing mass effect and right to left midline shift - now 5 mm, previously 2 mm.  No evidence of hydrocephalus.  Postoperative changes within the right frontal region and bony calvarium.   Original Report Authenticated By: Harmon Pier, M.D.      No diagnosis found.   Date: 09/15/2012  Rate: 59  Rhythm: normal sinus rhythm  QRS Axis: normal  Intervals: normal  ST/T Wave abnormalities: normal  Conduction Disutrbances:none  Narrative Interpretation:   Old EKG Reviewed: none available    MDM  Frank Scott is a 62 y.o. male here with vomiting, nausea, hypertension. Hypertension improved to 150s/90s on arrival. EKG unremarkable and he has no chest pain. Will repeat CT head to r/o further bleeding given recent subdural hemorrhage.   5:39 PM CT showed increased edema with some mass effect. I called neurosurgeon on call, Dr. Franky Macho, who reviewed the images and felt that it is likely postop changes. Patient neurologically intact so stable for outpatient f/u. Dr. Franky Macho and I decided on putting him on metoprolol 50mg  BID to control his BP better. Return precautions given to patient and family.         Richardean Canal, MD 09/15/12 [phone]

## 2012-09-25 ENCOUNTER — Telehealth (HOSPITAL_COMMUNITY): Payer: Self-pay | Admitting: Emergency Medicine

## 2012-09-25 NOTE — Telephone Encounter (Signed)
Faxed DC summary for review.

## 2012-09-28 ENCOUNTER — Other Ambulatory Visit: Payer: Self-pay | Admitting: Family Medicine
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
## 2012-09-28 NOTE — Telephone Encounter (Signed)
Sorry I don't know this patient.  It looks like Karie added me as PCP on 09/26/12.  Not sure why?  I should not be getting any new pts.  Can barely keep up with the ones I have.   Perhaps ask Gwendolyn Grant if he wants to fill it  Thanks  [employer]

## 2012-09-28 NOTE — Telephone Encounter (Signed)
Patient last seen by [facility] May 2011 as new patient at that time.  Has not followed up since then.  Will fill enough to get him to appt.

## 2012-10-03 ENCOUNTER — Encounter: Payer: Self-pay | Admitting: Family Medicine

## 2012-10-03 ENCOUNTER — Ambulatory Visit (INDEPENDENT_AMBULATORY_CARE_PROVIDER_SITE_OTHER): Payer: Worker's Compensation | Admitting: Family Medicine

## 2012-10-03 VITALS — BP 150/90 | HR 54 | Temp 98.1°F | Ht 72.0 in | Wt 273.1 lb

## 2012-10-03 DIAGNOSIS — S2241XA Multiple fractures of ribs, right side, initial encounter for closed fracture: Secondary | ICD-10-CM

## 2012-10-03 DIAGNOSIS — S066X9A Traumatic subarachnoid hemorrhage with loss of consciousness of unspecified duration, initial encounter: Secondary | ICD-10-CM

## 2012-10-03 DIAGNOSIS — I10 Essential (primary) hypertension: Secondary | ICD-10-CM

## 2012-10-03 DIAGNOSIS — S2249XA Multiple fractures of ribs, unspecified side, initial encounter for closed fracture: Secondary | ICD-10-CM

## 2012-10-03 LAB — COMPREHENSIVE METABOLIC PANEL
AST: 27 U/L (ref 0–37)
Alkaline Phosphatase: 165 U/L — ABNORMAL HIGH (ref 39–117)
BUN: 12 mg/dL (ref 6–23)
Creat: 1.02 mg/dL (ref 0.50–1.35)

## 2012-10-03 LAB — CBC
HCT: 38.6 % — ABNORMAL LOW (ref 39.0–52.0)
Hemoglobin: 13.1 g/dL (ref 13.0–17.0)
MCH: 27.8 pg (ref 26.0–34.0)
MCHC: 33.9 g/dL (ref 30.0–36.0)
MCV: 81.8 fL (ref 78.0–100.0)

## 2012-10-03 LAB — LIPID PANEL
Cholesterol: 221 mg/dL — ABNORMAL HIGH (ref 0–200)
Total CHOL/HDL Ratio: 6.3 Ratio
Triglycerides: 100 mg/dL (ref <150)
VLDL: 20 mg/dL (ref 0–40)

## 2012-10-03 NOTE — Patient Instructions (Addendum)
We are checking some labwork today.  I will send you a letter if everything looks in a couple of days.  If anything's abnormal I will call.  Check back in 4-6 weeks for a blood pressure recheck.

## 2012-10-04 ENCOUNTER — Telehealth: Payer: Self-pay | Admitting: Family Medicine

## 2012-10-04 DIAGNOSIS — I10 Essential (primary) hypertension: Secondary | ICD-10-CM | POA: Insufficient documentation

## 2012-10-04 NOTE — Assessment & Plan Note (Signed)
I recognize that he is currently on a beta blocker and this is not very good as initial therapy for high blood pressure. We did discuss this and patient was more comfortable staying on this medicine rather than restarting in trying something new. Recommend he followup in about a months he can recheck his blood pressure and we may need to make a switch to that point if he is not yet at goal

## 2012-10-04 NOTE — Assessment & Plan Note (Signed)
Per neurosurgery. His follow up with neurosurgery this coming Friday.

## 2012-10-04 NOTE — Assessment & Plan Note (Signed)
Minimal pain. Analgesics when necessary.

## 2012-10-04 NOTE — Telephone Encounter (Signed)
Can you guys call Mr. Amend either today or tomorrow and let him know that his cholesterol is elevated?  We can talk more about this next time he comes in.  Thanks, Trey Paula

## 2012-10-04 NOTE — Telephone Encounter (Signed)
Informed of below 

## 2012-10-04 NOTE — Progress Notes (Signed)
Subjective:    Frank Scott is a 62 y.o. male who presents to [facility] today to re-establish care. He is been relatively healthy until fall about a month ago, please see below. He does have history of hypertension. His concerns are listed below:  1.  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  Found to have depressed skull fracture with intraparenchymal hemorrhage and SAH.  He is now status post right frontotemporal craniectomy. He was discharged home but had trouble nausea vomiting the next day returned back to the emergency department. CT of head at that time showed some increased edema with midline shift. Reviewed by me BP and neurosurgeon on call who felt the changes were post operative period he was neurologically intact and therefore asked to follow up on outpatient basis. He therefore presents today. He denies any headaches. No cognitive deficits.  2. Hypertension:  Patient had been off of medications for some time before being admitted to the hospital status post fall. He was started on metoprolol after he left the hospital in the back with nausea vomiting. Currently he is still taking metoprolol. No HA, CP, dizziness, shortness of breath, palpitations, or LE swelling.   BP Readings from Last 3 Encounters:  10/03/12 150/90  09/15/12 161/88  09/14/12 159/77    The following portions of the patient's history were reviewed and updated as appropriate: allergies, current medications, past medical history, family and social history, and problem list. Patient is a nonsmoker.    PMH reviewed.  Past Medical History  Diagnosis Date  . DVT of deep femoral vein 1980s  . Hypertension    Past Surgical History  Procedure Laterality Date  . Craniotomy Right 09/07/2012    Procedure: Elevation of Decompressed Skull Grace Bushy;  Surgeon: Reinaldo Meeker, MD;  Location: [facility];  Service: Neurosurgery;  Laterality: Right;   Elevation of Right Fronto-Temporal Skull Fracture  . Appendectomy  1980s    Medications reviewed. Current Outpatient Prescriptions  Medication Sig Dispense Refill  . Docusate Sodium (DSS) 100 MG CAPS Take 100 mg by mouth 2 (two) times daily as needed (for constipation).      . metoprolol (LOPRESSOR) 50 MG tablet TAKE 1 TABLET BY MOUTH TWICE DAILY  30 tablet  0  . polyethylene glycol (MIRALAX / GLYCOLAX) packet Take 17 g by mouth daily as needed (for constipation).      . traMADol (ULTRAM) 50 MG tablet Take 50-100 mg by mouth every 6 (six) hours as needed for pain.       No current facility-administered medications for this visit.    ROS as above otherwise neg.  No chest pain, palpitations, SOB, Fever, Chills, Abd pain, N/V/D.   Objective:   Physical Exam BP 150/90  Pulse 54  Temp(Src) 98.1 F (36.7 C) (Oral)  Ht 6' (1.829 m)  Wt 273 lb 1.6 oz (123.877 kg)  BMI 37.03 kg/m2 Gen:  Alert, cooperative patient who appears stated age in no acute distress.  Vital signs reviewed. HEENT: Status post craniectomy changes right frontotemporal area. He does still have some sutures in place. Pupils equal round reactive to light bilaterally. EOMI,  MMM Cardiac:  Regular rate and rhythm without murmur auscultated.  Good S1/S2. Pulm:  Clear to auscultation bilaterally with good air movement.  No wheezes or rales noted.   Abd:  Soft/nondistended/nontender.  Good bowel sounds throughout all four quadrants.  No masses noted. Does have some mild tenderness  over right thoracic T6-T8 area midaxillary line. Exts: Non edematous BL  LE, warm and well perfused.  Neuro: Alert and oriented x4. Cranial nerves II through XII intact. Strength 5 out of 5 bilateral upper and lower extremities. Sensation 5 out of 5 throughout. Is able walk well without them or other difficulty with ambulation.

## 2012-10-05 ENCOUNTER — Telehealth: Payer: Self-pay | Admitting: *Deleted

## 2012-10-05 ENCOUNTER — Telehealth: Payer: Self-pay | Admitting: Family Medicine

## 2012-10-05 NOTE — Telephone Encounter (Signed)
Called and spoke with [name].  The form states that patient is seeing Dr. Aliene Beams today and he needs to fill out this form.  She will come by our office today and pick form back up.  Frank Scott

## 2012-10-05 NOTE — Telephone Encounter (Signed)
Pt's wife dropped off paperwork to be filled ou,t she says pt was here this Wednesday the 9th.  Wife states that paperwork needs to be fax to number on paper and she also want a copy sent to them for there records.

## 2012-10-05 NOTE — Telephone Encounter (Signed)
Mr. Tangonan calling to let Dr. Gwendolyn Grant know that he saw his neurosurgeon today and he does not have any issues with Dr. Gwendolyn Grant changing his blood pressure medicine.  I advised Mr. Poplaski I would send this message to Dr. Gwendolyn Grant but that he is out of the office next week.  Ileana Ladd

## 2012-10-17 NOTE — Telephone Encounter (Signed)
Sounds good.  Will discuss at next visit.

## 2012-10-17 NOTE — Telephone Encounter (Signed)
Appreciate letting me know.  Will address next visit.

## 2015-03-09 IMAGING — CT CT ABD-PELV W/ CM
2 of 5 series · 14 of 36 positions shown, 17 images · IV contrast (omnipaque)
Comparison: None.

CT CHEST

CLINICAL DATA: Fall from approximately 30 feet, right flank/chest
pain

CT CHEST, ABDOMEN AND PELVIS WITH CONTRAST
TECHNIQUE: Multidetector CT imaging of the chest, abdomen and
pelvis was performed following the standard protocol during bolus
administration of intravenous contrast.
Contrast: 100mL OMNIPAQUE IOHEXOL 300 MG/ML  SOLN

[Series 3: routine c/a/p 5.0 st · axial · 0.75mm/px · z∈[-644,-54]mm · 11 of 136 slices shown, 14 images]
[im 9/136  mediastinal]
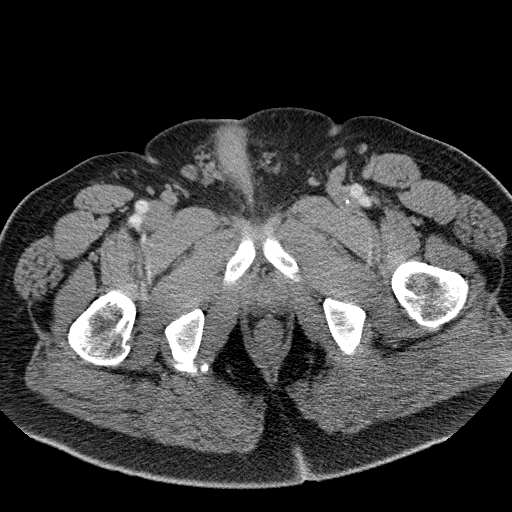
[im 9/136  lung]
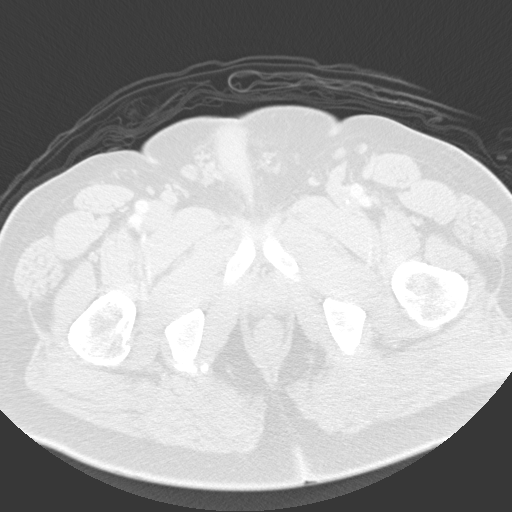
[im 26/136  lung]
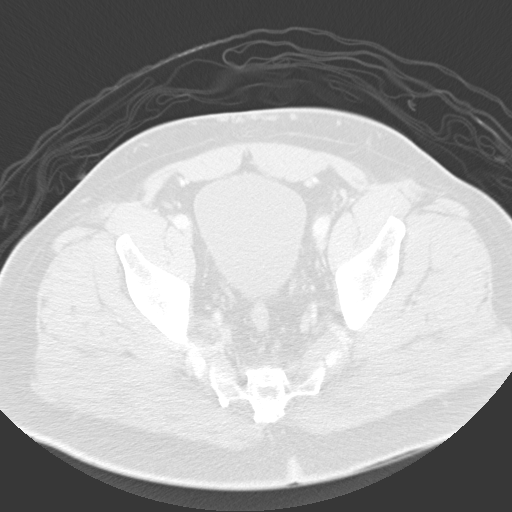
[im 34/136  lung]
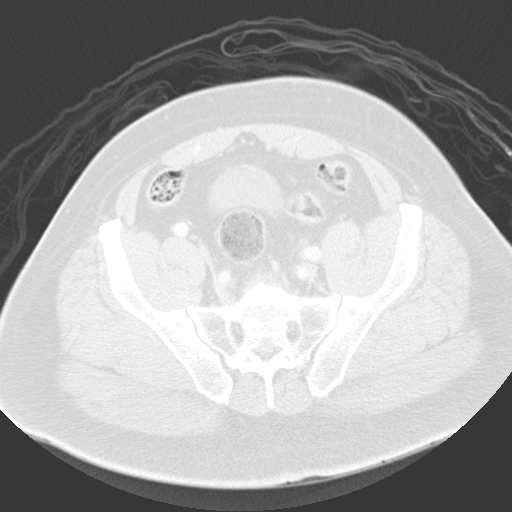
[im 43/136  lung]
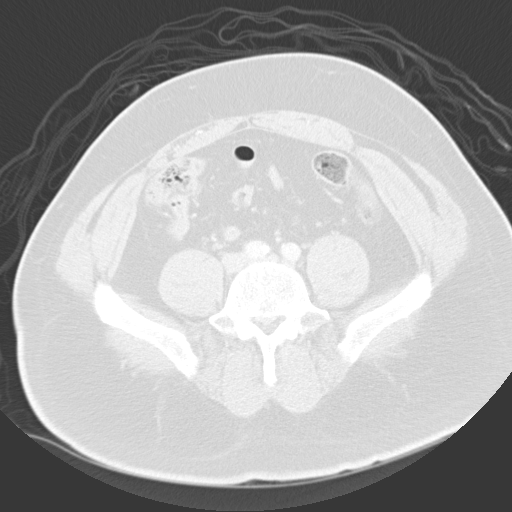
[im 60/136  mediastinal]
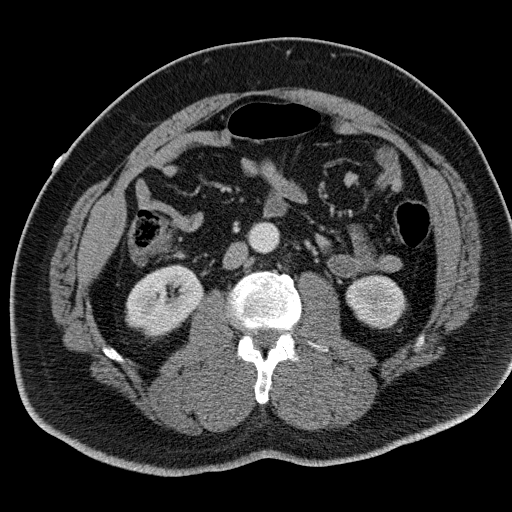
[im 60/136  lung]
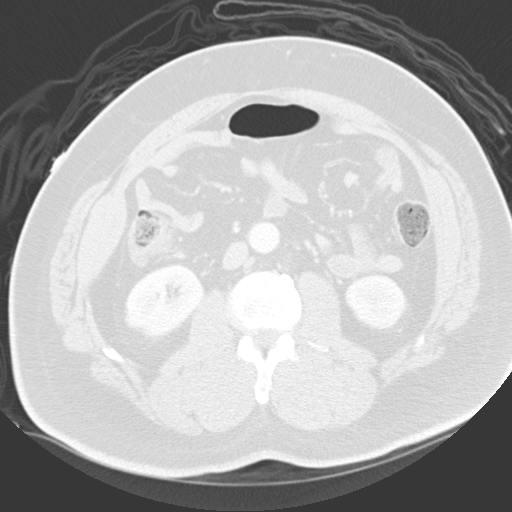
[im 68/136  lung]
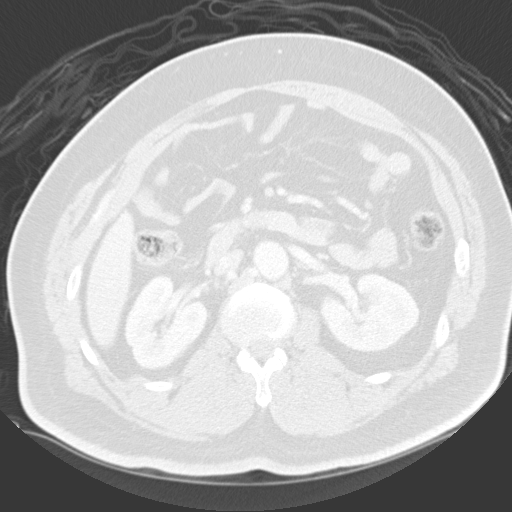
[im 76/136  lung]
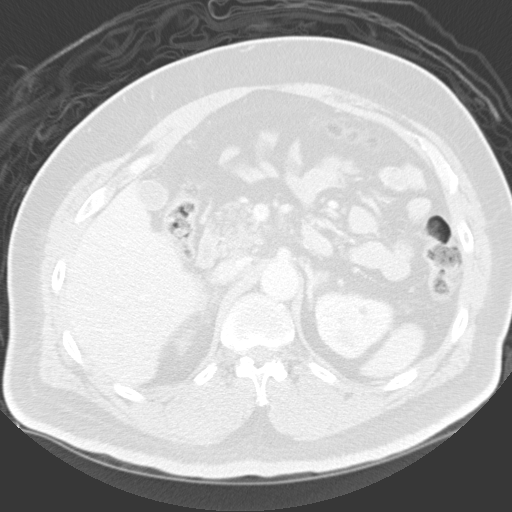
[im 93/136  lung]
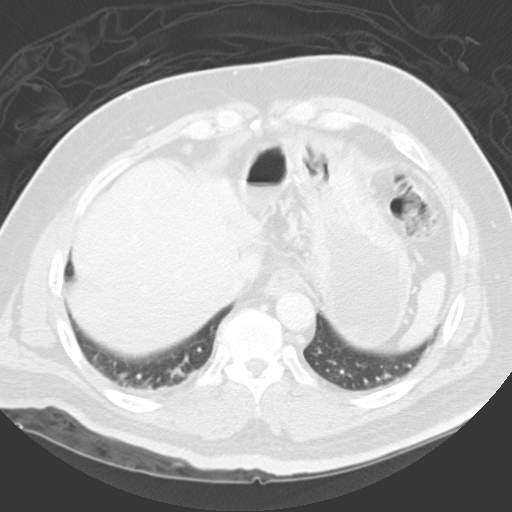
[im 102/136  mediastinal]
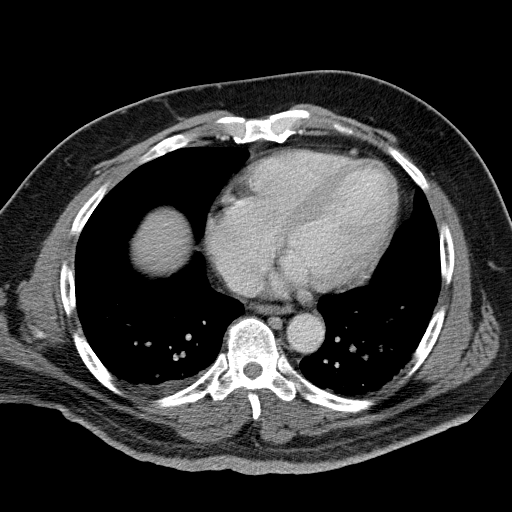
[im 102/136  lung]
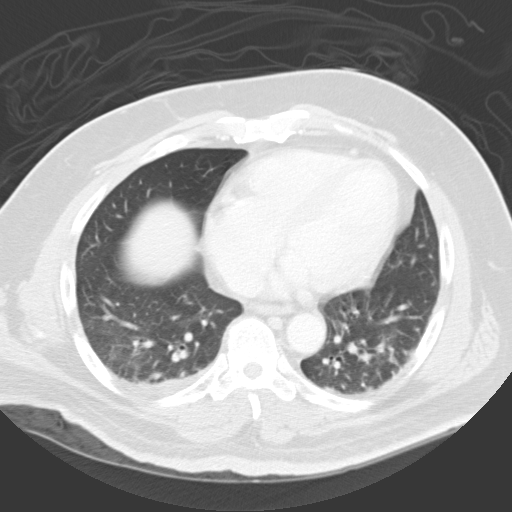
[im 110/136  lung]
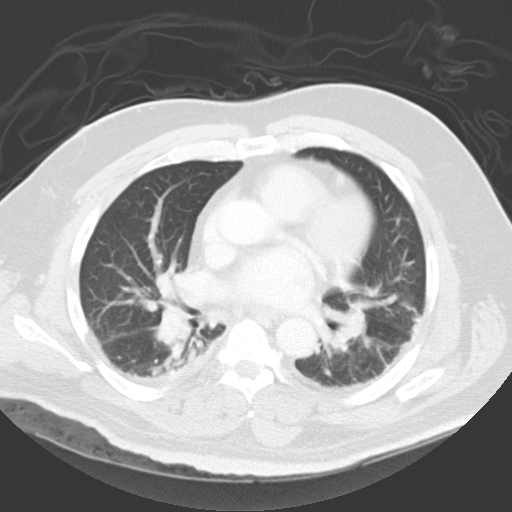
[im 127/136  lung]
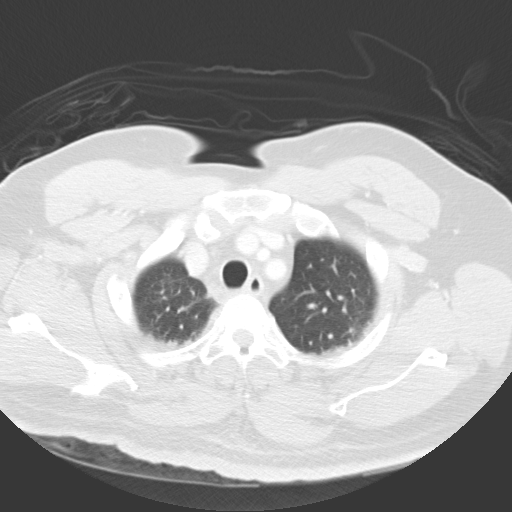

[Series 6: coronals · coronal · 0.86mm/px · 3 of 149 slices shown]
[im 30/149  lung]
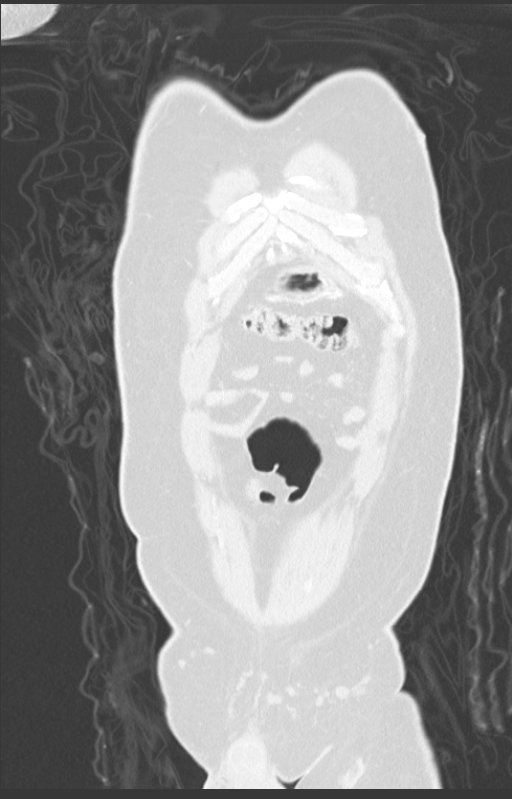
[im 60/149  lung]
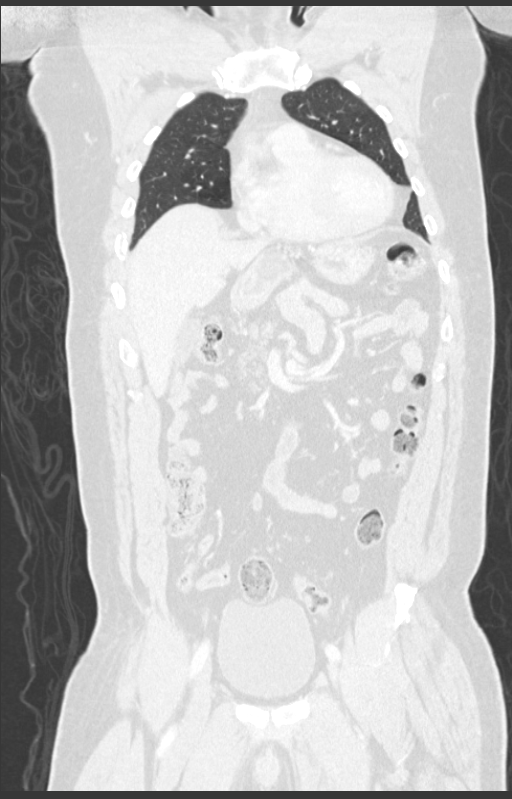
[im 89/149  lung]
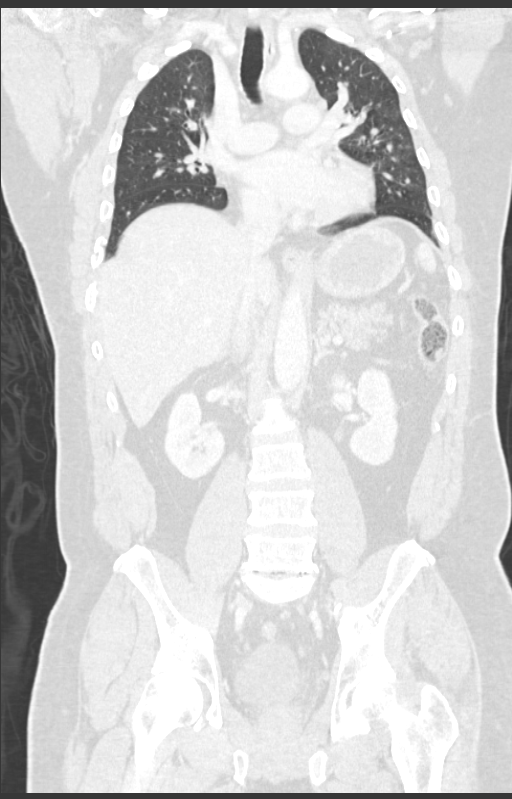

[14 of 36 positions shown; findings below may reference images not displayed]

FINDINGS: No evidence of traumatic aortic injury.  No mediastinal
hematoma.

Patchy opacities in the posterior upper and lower lobes, favored to
reflect dependent atelectasis, less likely aspiration.  Trace right
pleural effusion.  No pneumothorax.

Visualized thyroid is unremarkable.

The heart is top normal in size.  No pericardial effusion.

No suspicious mediastinal or axillary lymphadenopathy.

Nondisplaced fractures of the anterolateral right 4th rib (series
3/image 30) and the lateral 2th-5th ribs.
IMPRESSION: Nondisplaced fractures of the right 8th-3th ribs.

Trace right pleural effusion.

Patchy opacities in the posterior upper and lower lobes, favored to
reflect a dependent atelectasis, less likely aspiration.

CT ABDOMEN AND PELVIS
FINDINGS: Liver, spleen, and adrenal glands are within normal
limits.

Calcification in the pancreatic head (series 3/image 64), possibly
sequela of prior/chronic pancreatitis.

Gallbladder is unremarkable.  No intrahepatic or extrahepatic
ductal dilatation.

Kidneys are within normal limits.  No hydronephrosis.

No evidence of bowel obstruction.

No abdominopelvic ascites.

No hemoperitoneum.  No free air.

Atherosclerotic calcifications of the abdominal aorta and branch
vessels. Intraluminal calcification within the left femoral vein
(series 3/image 125 and 135) with associated left lower
abdominopelvic collaterals (series 3/image 120), favored to reflect
sequela of chronic deep venous thrombosis with collateralization.
The bilateral iliac venous system is narrowed but symmetric.

No suspicious abdominopelvic lymphadenopathy.

Prostate is unremarkable.

Bladder is within normal limits.

Mild degenerative changes of the lumbar spine.  No fracture is
seen.
IMPRESSION: No evidence of traumatic injury to the abdomen/pelvis.

Suspected sequela of chronic left femoral DVT with
collateralization.

## 2015-10-13 ENCOUNTER — Ambulatory Visit (INDEPENDENT_AMBULATORY_CARE_PROVIDER_SITE_OTHER): Payer: BC Managed Care – PPO | Admitting: Family Medicine

## 2015-10-13 ENCOUNTER — Encounter: Payer: Self-pay | Admitting: Family Medicine

## 2015-10-13 VITALS — BP 164/92 | HR 61 | Temp 98.2°F | Ht 72.0 in | Wt 279.7 lb

## 2015-10-13 DIAGNOSIS — N529 Male erectile dysfunction, unspecified: Secondary | ICD-10-CM

## 2015-10-13 DIAGNOSIS — S06369D Traumatic hemorrhage of cerebrum, unspecified, with loss of consciousness of unspecified duration, subsequent encounter: Secondary | ICD-10-CM

## 2015-10-13 DIAGNOSIS — I10 Essential (primary) hypertension: Secondary | ICD-10-CM

## 2015-10-13 LAB — BASIC METABOLIC PANEL
BUN: 10 mg/dL (ref 7–25)
CALCIUM: 9 mg/dL (ref 8.6–10.3)
CO2: 27 mmol/L (ref 20–31)
CREATININE: 1.06 mg/dL (ref 0.70–1.25)
Chloride: 100 mmol/L (ref 98–110)
Glucose, Bld: 272 mg/dL — ABNORMAL HIGH (ref 65–99)
Potassium: 4.2 mmol/L (ref 3.5–5.3)
SODIUM: 136 mmol/L (ref 135–146)

## 2015-10-13 LAB — CBC
HCT: 43.5 % (ref 38.5–50.0)
HEMOGLOBIN: 14.5 g/dL (ref 13.2–17.1)
MCH: 27.6 pg (ref 27.0–33.0)
MCHC: 33.3 g/dL (ref 32.0–36.0)
MCV: 82.9 fL (ref 80.0–100.0)
MPV: 11.3 fL (ref 7.5–12.5)
PLATELETS: 217 10*3/uL (ref 140–400)
RBC: 5.25 MIL/uL (ref 4.20–5.80)
RDW: 13.4 % (ref 11.0–15.0)
WBC: 4.6 10*3/uL (ref 3.8–10.8)

## 2015-10-13 LAB — LIPID PANEL
CHOLESTEROL: 242 mg/dL — AB (ref 125–200)
HDL: 44 mg/dL (ref >=40)
LDL Cholesterol: 169 mg/dL — ABNORMAL HIGH (ref <130)
TRIGLYCERIDES: 143 mg/dL (ref <150)
Total CHOL/HDL Ratio: 5.5 Ratio — ABNORMAL HIGH (ref <=5.0)
VLDL: 29 mg/dL (ref <30)

## 2015-10-13 LAB — TSH: TSH: 1.41 m[IU]/L (ref 0.40–4.50)

## 2015-10-13 NOTE — Progress Notes (Signed)
Subjective:    Frank Scott is a 65 y.o. male who presents to [facility] today for several issues:  1.  HTN:  Not currently taking metoprolol and hasn't been on this "for months to years."  Not checking it regularly.  No HA, CP, dizziness, shortness of breath, palpitations, or LE swelling.  Repeat BP was 164/92.   BP Readings from Last 3 Encounters:  10/13/15 183/99  10/03/12 150/90  09/15/12 161/88   2.  Fatigue:  Not sleeping well at night.  Wife with apnea, he awakens and worries about her every night.  Feels tired in AM.  He himself doesn't snore or have apneic episodes.  No melena or hematochezia.  No changes in levels of activity or mood.  Denies depressive symptoms.   3.  Intracranial bleed:  Fall from ladder several years ago.  Since then, he's had no sequelae.  No dizziness, lightheadedness, changes in balance.  Denies headaches  4.  ED:  Has had increased erectile dysfunction over the past several months.  Does have AM erections.  Asking for some form of sample for ED medication.  No lightheadedness, chest pain as above.  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    ROS as above per HPI, otherwise neg.   The following portions of the patient's history were reviewed and updated as appropriate: allergies, current medications, past medical history, family and social history, and problem list. Patient is a nonsmoker.    PMH reviewed.  Past Medical History  Diagnosis Date  . DVT of deep femoral vein ([place]) 1980s  . Hypertension    Past Surgical History  Procedure Laterality Date  . Craniotomy Right 09/07/2012    Procedure: Elevation of Decompressed Skull Charolette Forward;  Surgeon: Faythe Ghee, MD;  Location: [place] [facility];  Service: Neurosurgery;  Laterality: Right;  Elevation of Right Fronto-Temporal Skull Fracture  . Appendectomy  1980s    Medications reviewed. Current Outpatient Prescriptions  Medication Sig Dispense Refill  . Docusate Sodium (DSS) 100 MG CAPS Take  100 mg by mouth 2 (two) times daily as needed (for constipation).    . metoprolol (LOPRESSOR) 50 MG tablet TAKE 1 TABLET BY MOUTH TWICE DAILY 30 tablet 0  . polyethylene glycol (MIRALAX / GLYCOLAX) packet Take 17 g by mouth daily as needed (for constipation).    . traMADol (ULTRAM) 50 MG tablet Take 50-100 mg by mouth every 6 (six) hours as needed for pain.     No current facility-administered medications for this visit.   Family History: - Parents with hypertension.  No family history of intracranial bleed or bleeding disorders.   Objective:   Physical Exam BP 183/99 mmHg  Pulse 61  Temp(Src) 98.2 F (36.8 C) (Oral)  Ht 6' (1.829 m)  Wt 279 lb 11.2 oz (126.871 kg)  BMI 37.93 kg/m2 Gen:  Alert, cooperative patient who appears stated age in no acute distress.  Vital signs reviewed. HEENT: EOMI,  MMM Cardiac:  Regular rate and rhythm without murmur auscultated.  Good S1/S2. Pulm:  Clear to auscultation bilaterally with good air movement.  No wheezes or rales noted.   Abd:  Soft/nondistended/nontender.  Good bowel sounds throughout all four quadrants.  No masses noted.  Exts: Non edematous BL  LE, warm and well perfused.   No results found for this or any previous visit (from the past 72 hour(s)).

## 2015-10-13 NOTE — Patient Instructions (Signed)
It was good to see you again today.    We are checking labs which might be contributing to your fatigue.   I'll call and let you know the results.

## 2015-10-14 ENCOUNTER — Telehealth: Payer: Self-pay | Admitting: Family Medicine

## 2015-10-14 MED ORDER — SILDENAFIL CITRATE 100 MG PO TABS: 50.0000 mg | ORAL_TABLET | Freq: Every day | ORAL | Status: DC | PRN

## 2015-10-14 NOTE — Telephone Encounter (Signed)
Please send rx for ED to [facility] on [address].  Patient said this was to have been done yesterday after his visit.

## 2015-10-14 NOTE — Telephone Encounter (Signed)
Completed.

## 2015-10-15 DIAGNOSIS — N529 Male erectile dysfunction, unspecified: Secondary | ICD-10-CM | POA: Insufficient documentation

## 2015-10-15 NOTE — Assessment & Plan Note (Signed)
Trial of Viagra.  Still has AM erections, less likely vascular.  Red flags provided.   FU if no improvement or side effects.

## 2015-10-15 NOTE — Assessment & Plan Note (Signed)
No sequelae.  Doing very well.

## 2015-10-15 NOTE — Assessment & Plan Note (Signed)
Not controlled currently.   He is hesitant to start anything today. Recommended FU for BP recheck and start potential thiazide at that visit.

## 2015-10-16 ENCOUNTER — Telehealth: Payer: Self-pay | Admitting: *Deleted

## 2015-10-16 DIAGNOSIS — R739 Hyperglycemia, unspecified: Secondary | ICD-10-CM

## 2015-10-16 NOTE — Telephone Encounter (Signed)
Called and had to leave message on phone for patient to return call.

## 2015-10-16 NOTE — Telephone Encounter (Signed)
Prior Authorization received from [place] for Viagra 100 mg. Patient's insurance was contacted for PA form.  The medication is not covered/plan exclusion.  Request for coverage was denied.  An appeal can be completed in writing.  The denial forms placed in provider box for review.   Derl Barrow, RN

## 2015-10-19 NOTE — Telephone Encounter (Signed)
Patient returning MD's Call.

## 2015-10-20 NOTE — Telephone Encounter (Signed)
If patient returns call, please ask him to come back for a lab appt.  His blood sugar and cholesterol were both fairly high.  He needs to schedule the lab appt when it's best for him.

## 2015-10-22 NOTE — Telephone Encounter (Signed)
Appt made for 10/28/2015. Ottis Stain, CMA

## 2015-10-28 ENCOUNTER — Other Ambulatory Visit (INDEPENDENT_AMBULATORY_CARE_PROVIDER_SITE_OTHER): Payer: BC Managed Care – PPO

## 2015-10-28 DIAGNOSIS — R739 Hyperglycemia, unspecified: Secondary | ICD-10-CM

## 2015-10-28 LAB — BASIC METABOLIC PANEL
BUN: 10 mg/dL (ref 7–25)
CALCIUM: 9 mg/dL (ref 8.6–10.3)
CO2: 28 mmol/L (ref 20–31)
Chloride: 95 mmol/L — ABNORMAL LOW (ref 98–110)
Creat: 1.01 mg/dL (ref 0.70–1.25)
Glucose, Bld: 318 mg/dL — ABNORMAL HIGH (ref 65–99)
POTASSIUM: 4.1 mmol/L (ref 3.5–5.3)
SODIUM: 130 mmol/L — AB (ref 135–146)

## 2015-10-28 LAB — POCT GLYCOSYLATED HEMOGLOBIN (HGB A1C): HEMOGLOBIN A1C: 13.1

## 2015-11-01 ENCOUNTER — Telehealth: Payer: Self-pay | Admitting: Family Medicine

## 2015-11-01 NOTE — Telephone Encounter (Signed)
Please call this patient and let him know that I am very concerned about his high blood sugars.  He needs to make an appt this week ASAP to see someone.  Thanks!  JW

## 2015-11-02 NOTE — Telephone Encounter (Signed)
Appt made for Wed at 3:00 with Dr. Juleen China. Ottis Stain, CMA

## 2015-11-04 ENCOUNTER — Ambulatory Visit (INDEPENDENT_AMBULATORY_CARE_PROVIDER_SITE_OTHER): Payer: BC Managed Care – PPO | Admitting: Internal Medicine

## 2015-11-04 ENCOUNTER — Encounter: Payer: Self-pay | Admitting: Internal Medicine

## 2015-11-04 VITALS — BP 162/98 | HR 72 | Ht 72.0 in | Wt 281.0 lb

## 2015-11-04 DIAGNOSIS — E119 Type 2 diabetes mellitus without complications: Secondary | ICD-10-CM

## 2015-11-04 MED ORDER — GLUCOSE BLOOD VI STRP: ORAL_STRIP | Status: DC

## 2015-11-04 MED ORDER — INSULIN GLARGINE 100 UNIT/ML SOLOSTAR PEN: 10.0000 [IU] | PEN_INJECTOR | Freq: Every morning | SUBCUTANEOUS | Status: DC

## 2015-11-04 MED ORDER — METFORMIN HCL 500 MG PO TABS: 500.0000 mg | ORAL_TABLET | Freq: Two times a day (BID) | ORAL | Status: DC

## 2015-11-04 MED ORDER — ONETOUCH DELICA LANCETS 33G MISC: Status: DC

## 2015-11-04 MED ORDER — ONETOUCH ULTRA 2 W/DEVICE KIT: PACK | Status: AC

## 2015-11-04 NOTE — Patient Instructions (Addendum)
Please make a follow up appointment within the next 2 weeks. It is important that we monitor this new diagnosis of diabetes closely. We will also need to discuss your blood pressure at your next visit.   Use lantus 10 units every morning. Check a blood sugar every morning prior to eating something and record this number. Try to take one more blood sugar reading sometime during the day two hours after a meal.   I have also prescribed another medication (metformin) that helps to control your diabetes.

## 2015-11-05 DIAGNOSIS — E119 Type 2 diabetes mellitus without complications: Secondary | ICD-10-CM | POA: Insufficient documentation

## 2015-11-05 NOTE — Assessment & Plan Note (Signed)
Uncontrolled, new-onset T2DM with A1C of 13.1. Had a long discussion with patient about the cause of T2DM and potential treatments/lifestyle modifications. Pharmacy team helped with medication education.  -started on Metformin 500 mg BID, PCP can titrate up as needed and as patient tolerates  -Lantus 10 units started with supplies for insulin and monitoring ordered  -patient to check a fasting AM CBG as well as one 2 hour postprandial CBG each day and keep log until f/u  -briefly discussed diet and exercise with patient, would recommend nutrition consult for further education -consider pharmacy consult for further education as well  -f/u within 1-2 weeks with blood sugar log

## 2015-11-05 NOTE — Progress Notes (Signed)
   Subjective:    Frank Scott - 65 y.o. male MRN [id number]  Date of birth: 1950-09-14  HPI  Frank Scott is here for new onset T2DM.   T2DM: Patient unaware of the new diagnosis at beginning of encounter. Is aware that he had elevated blood sugars on labs and was instructed to return for another lab (A1C), which he did. A1C was found to be 13.1. Patient reports that he knows diabetes affects all organs and believes the liver is the main culprit for T2DM. Is aware that insulin is a treatment for diabetes. Wishes he did not have to give himself shots but is willing to be compliant with insulin therapy if necessary. Reflects that he does like a lot of sugar in his diet. Concerned about having to give up sugar completely.   -  reports that he has never smoked. He does not have any smokeless tobacco history on file. - Review of Systems: Per HPI. - Past Medical History: Patient Active Problem List   Diagnosis Date Noted  . Type 2 diabetes mellitus ([place]) 11/05/2015  . Erectile dysfunction 10/15/2015  . HTN (hypertension) 10/04/2012  . Traumatic intracerebral hemorrhage ([place]) 09/07/2012  . OBESITY 11/02/2009   - Medications: reviewed and updated    Objective:   Physical Exam BP 162/98 mmHg  Pulse 72  Ht 6' (1.829 m)  Wt 281 lb (127.461 kg)  BMI 38.10 kg/m2 Gen: NAD, alert, cooperative with exam, well-appearing HEENT: NCAT, PERRL, clear conjunctiva, oropharynx clear, supple neck CV: RRR, good S1/S2, no murmur, no edema, capillary refill brisk  Resp: CTABL, no wheezes, non-labored Abd: SNTND, BS present, no guarding or organomegaly Skin: no rashes, normal turgor  Neuro: no gross deficits.  Psych: good insight, alert and oriented     Assessment & Plan:   Type 2 diabetes mellitus (HCC) Uncontrolled, new-onset T2DM with A1C of 13.1. Had a long discussion with patient about the cause of T2DM and potential treatments/lifestyle modifications. Pharmacy team helped with medication  education.  -started on Metformin 500 mg BID, PCP can titrate up as needed and as patient tolerates  -Lantus 10 units started with supplies for insulin and monitoring ordered  -patient to check a fasting AM CBG as well as one 2 hour postprandial CBG each day and keep log until f/u  -briefly discussed diet and exercise with patient, would recommend nutrition consult for further education -consider pharmacy consult for further education as well  -f/u within 1-2 weeks with blood sugar log    Will need further discussion of HTN and HLD with PCP at next visit.   Phill Myron, D.O. 11/05/2015, 1:28 PM PGY-1, [place]

## 2015-11-06 ENCOUNTER — Ambulatory Visit: Payer: Self-pay | Admitting: Internal Medicine

## 2015-11-06 ENCOUNTER — Telehealth: Payer: Self-pay | Admitting: *Deleted

## 2015-11-06 MED ORDER — INSULIN PEN NEEDLE 31G X 8 MM MISC: Status: DC

## 2015-11-06 NOTE — Telephone Encounter (Signed)
Patient called and states that he has received his lantus pen along with his testing supplies but didn't get a script for his pen needles.  Needs this sent in today because he hasn't been able to start his lantus.  Will forward to triage nurse to send to pharmacy. Maurisha Mongeau,CMA

## 2015-11-11 ENCOUNTER — Ambulatory Visit: Payer: Self-pay | Admitting: Family Medicine

## 2015-11-16 ENCOUNTER — Encounter: Payer: Self-pay | Admitting: Internal Medicine

## 2015-11-16 ENCOUNTER — Ambulatory Visit (INDEPENDENT_AMBULATORY_CARE_PROVIDER_SITE_OTHER): Payer: BC Managed Care – PPO | Admitting: Internal Medicine

## 2015-11-16 VITALS — BP 169/90 | HR 59 | Temp 97.9°F | Ht 72.0 in | Wt 281.0 lb

## 2015-11-16 DIAGNOSIS — E785 Hyperlipidemia, unspecified: Secondary | ICD-10-CM | POA: Diagnosis not present

## 2015-11-16 DIAGNOSIS — E119 Type 2 diabetes mellitus without complications: Secondary | ICD-10-CM

## 2015-11-16 DIAGNOSIS — I10 Essential (primary) hypertension: Secondary | ICD-10-CM

## 2015-11-16 MED ORDER — HYDROCHLOROTHIAZIDE 12.5 MG PO TABS: 12.5000 mg | ORAL_TABLET | Freq: Every day | ORAL | Status: DC

## 2015-11-16 NOTE — Assessment & Plan Note (Signed)
Not controlled. Kidney function WNL on recent labs.  -start HCTZ 12.5 mg daily  -recheck BP in 2 weeks, increase medication prn

## 2015-11-16 NOTE — Assessment & Plan Note (Signed)
ASCVD risk score calculated to be 26.5%.  -would like to start on high intensity statin and ASA therapy  -patient very overwhelmed by new diagnosis of T2DM and initiation of HCTZ today, would recommend discussing these medications in 2 weeks at next f/u

## 2015-11-16 NOTE — Progress Notes (Signed)
   Subjective:    Frank Scott - 65 y.o. male MRN [id number]  Date of birth: December 24, 1950  HPI  Frank Scott is here for follow up for T2DM and HTN.  T2DM: Recently diagnosed with A1C >13. Started on Lantus 10 units daily at last visit. Has had good compliance and has been keeping a detailed CBG log. Fasting CBGs have been 170s-220s. Highest fasting CBG >300. Postprandial CBGs have been 200s-300s. Has been coping with diagnosis fairly well. No difficulties with giving himself insulin. Has had problems with finger sticks. Says it takes him multiple attempts each time to draw blood. Reports concern regarding giving up dessert completely. Talked about how he grew up eating dessert daily. He does not walk much daily. His wife walks frequently and walked 3 miles yesterday. He says he dreams of walking with his wife but is not ready to start yet.   HTN: BPs have been elevated at past visits. He has been resistant to medication in the past but agrees now that it might be what is best for him. Denies CP, SOB, vision changes, and LE edema.      Health Maintenance Due  Topic Date Due  . Hepatitis C Screening  1950/09/05  . PNEUMOCOCCAL POLYSACCHARIDE VACCINE (1) 11/30/1953  . FOOT EXAM  11/30/1961  . OPHTHALMOLOGY EXAM  11/30/1961  . URINE MICROALBUMIN  11/30/1961  . HIV Screening  12/01/1966  . COLONOSCOPY  11/30/2001  . ZOSTAVAX  12/01/2011    -  reports that he has never smoked. He does not have any smokeless tobacco history on file. - Review of Systems: Per HPI. - Past Medical History: Patient Active Problem List   Diagnosis Date Noted  . Hyperlipidemia 11/16/2015  . Type 2 diabetes mellitus ([place]) 11/05/2015  . Erectile dysfunction 10/15/2015  . HTN (hypertension) 10/04/2012  . Traumatic intracerebral hemorrhage ([place]) 09/07/2012  . OBESITY 11/02/2009   - Medications: reviewed and updated    Objective:   Physical Exam BP 169/90 mmHg  Pulse 59  Temp(Src) 97.9 F (36.6 C) (Oral)   Ht 6' (1.829 m)  Wt 281 lb (127.461 kg)  BMI 38.10 kg/m2 Gen: NAD, alert, cooperative with exam, well-appearing CV: RRR, good S1/S2, no murmur, no edema, capillary refill brisk  Resp: CTABL, no wheezes, non-labored   Assessment & Plan:   Type 2 diabetes mellitus (HCC) Blood sugars have been uncontrolled per log results. Patient has had good adherence with medications and checking CBGs.  -will titrate insulin 1 unit per day for fasting CBGs >130  -continue metformin 500 mg BID, consider increasing dose at f/u visit  -patient resistant to nutrition consult at this time, will continue to discuss at future visits  -hypoglycemic symptoms and instructions discussed  -f/u in 2-3 weeks   HTN (hypertension) Not controlled. Kidney function WNL on recent labs.  -start HCTZ 12.5 mg daily  -recheck BP in 2 weeks, increase medication prn   Hyperlipidemia ASCVD risk score calculated to be 26.5%.  -would like to start on high intensity statin and ASA therapy  -patient very overwhelmed by new diagnosis of T2DM and initiation of HCTZ today, would recommend discussing these medications in 2 weeks at next f/u      Phill Myron, D.O. 11/16/2015, 11:35 AM PGY-1, [place]

## 2015-11-16 NOTE — Patient Instructions (Signed)
If blood sugar is above 130 in the morning, increase Lantus by 1 unit daily. Keep checking your blood sugar twice per day. Always once fasting in the morning and then 2 hours after a meal (lunch or dinner).   Symptoms of low blood sugar: dizziness, sweaty, shaking, feeling like going to pass out.   Do NOT increase lantus if blood sugar is low.   I will be prescribing HCTZ for your blood pressure. Take this daily as prescribed.   Follow up in 2-3 weeks for blood pressure check and blood sugar log.

## 2015-11-16 NOTE — Assessment & Plan Note (Signed)
Blood sugars have been uncontrolled per log results. Patient has had good adherence with medications and checking CBGs.  -will titrate insulin 1 unit per day for fasting CBGs >130  -continue metformin 500 mg BID, consider increasing dose at f/u visit  -patient resistant to nutrition consult at this time, will continue to discuss at future visits  -hypoglycemic symptoms and instructions discussed  -f/u in 2-3 weeks

## 2015-11-30 ENCOUNTER — Encounter: Payer: Self-pay | Admitting: Internal Medicine

## 2015-11-30 ENCOUNTER — Ambulatory Visit (INDEPENDENT_AMBULATORY_CARE_PROVIDER_SITE_OTHER): Payer: BC Managed Care – PPO | Admitting: Internal Medicine

## 2015-11-30 VITALS — Temp 98.2°F | Ht 72.0 in | Wt 282.0 lb

## 2015-11-30 DIAGNOSIS — I10 Essential (primary) hypertension: Secondary | ICD-10-CM | POA: Diagnosis not present

## 2015-11-30 DIAGNOSIS — E119 Type 2 diabetes mellitus without complications: Secondary | ICD-10-CM | POA: Diagnosis not present

## 2015-11-30 DIAGNOSIS — Z794 Long term (current) use of insulin: Secondary | ICD-10-CM

## 2015-11-30 MED ORDER — METFORMIN HCL 1000 MG PO TABS: 1000.0000 mg | ORAL_TABLET | Freq: Two times a day (BID) | ORAL | Status: DC

## 2015-11-30 MED ORDER — LISINOPRIL-HYDROCHLOROTHIAZIDE 10-12.5 MG PO TABS: 1.0000 | ORAL_TABLET | Freq: Every day | ORAL | Status: DC

## 2015-11-30 NOTE — Patient Instructions (Signed)
Today we talked about:   Diabetes:  -Continue with the regimen of increasing your insulin by 1 unit per day when your morning fasting blood sugar is above 130. When you have high after meal blood sugars (>200) please write down what you ate for that meal.  -If you start having low blood sugars (fasting less than 90) please call the clinic for further instructions. You will most likely need to be seen for an appointment.  -We will increase your Metformin to 1000 mg twice per day.   High Blood pressure -I have prescribed a combination pill of the current med you are taking now (HCTZ) and the new medication (lisinopril) which helps protect your kidney.  -Please return for a lab check in 1-2 weeks since we are adding the Lisinopril.  -Check your blood pressure once or twice at home weekly. Also check if you ever feel dizzy.

## 2015-11-30 NOTE — Progress Notes (Signed)
   Subjective:    Frank Scott - 65 y.o. male MRN [id number]  Date of birth: 1950-08-05  HPI  Frank Scott is here for follow up of chronic medical conditions.  Diabetes mellitus, Type 2 Disease Monitoring: Blood Sugar Ranges: Fasting - has been 128-133 the past 4 mornings; was previously in the 150s-170s; Postprandial: Mostly >200. A few in the 160s-180s. An isolated number of 422.  Polyuria: No  Visual problems: no   Last A1C: 13.1  Medication Compliance: yes, is now up to Lantus 20 untis daily  Medication Side Effects Hypoglycemia: no   Denies GI side effects with metformin.   Hypertension Blood pressure at home: 125/95; 140/90  Blood pressure today: 168/95 Meds: Compliant with HCTZ.  Side effects: None  ROS: Denies headache, dizziness, visual changes, nausea, vomiting, chest pain, abdominal pain or shortness of breath.   Health Maintenance Due  Topic Date Due  . Hepatitis C Screening  1951-04-04  . PNEUMOCOCCAL POLYSACCHARIDE VACCINE (1) 11/30/1953  . FOOT EXAM  11/30/1961  . OPHTHALMOLOGY EXAM  11/30/1961  . URINE MICROALBUMIN  11/30/1961  . HIV Screening  12/01/1966  . COLONOSCOPY  11/30/2001  . ZOSTAVAX  12/01/2011    -  reports that he has never smoked. He does not have any smokeless tobacco history on file. - Review of Systems: Per HPI. - Past Medical History: Patient Active Problem List   Diagnosis Date Noted  . Hyperlipidemia 11/16/2015  . Type 2 diabetes mellitus ([place]) 11/05/2015  . Erectile dysfunction 10/15/2015  . HTN (hypertension) 10/04/2012  . Traumatic intracerebral hemorrhage ([place]) 09/07/2012  . OBESITY 11/02/2009   - Medications: reviewed and updated    Objective:   Physical Exam Temp(Src) 98.2 F (36.8 C) (Oral)  Ht 6' (1.829 m)  Wt 127.914 kg (282 lb)  BMI 38.24 kg/m2 Gen: NAD, alert, cooperative with exam, well-appearing CV: RRR, good S1/S2, no murmur, no edema, capillary refill  brisk  Resp: CTABL, no wheezes, non-labored Psych: good insight, alert and oriented    Assessment & Plan:   HTN (hypertension) Currently uncontrolled at office visit.  -will add lisinopril in light of T2DM  -Rx for HCTZ-lisinopril combo given  -patient to check BP at home 1-2 x/week prior to next follow up  -BMET in 1-2 weeks given initiation of lisinopril -f/u in 1 month for BP check and possible adjustment of medication   Type 2 diabetes mellitus ([place]) CBGs still uncontrolled but improvement from OV 2 weeks ago. Fasting CBGs are almost at the level of desired control but still with significantly elevated post-prandial blood sugars.  -continue with current plan of increasing insulin 1 unit for every fasting CBG >130 starting from his achieved dose of Lantus 20 units daily  -increase Metformin from 500 mg BID to 1000 mg BID for better glycemic control  -hypoglycemic precautions given, patient to call clinic if fasting CBGs below 90 on average  -f/u in one month      Phill Myron, D.O. 12/01/2015, 1:59 PM PGY-1, [place]

## 2015-11-30 NOTE — Assessment & Plan Note (Signed)
Currently uncontrolled at office visit.  -will add lisinopril in light of T2DM  -Rx for HCTZ-lisinopril combo given  -patient to check BP at home 1-2 x/week prior to next follow up  -BMET in 1-2 weeks given initiation of lisinopril -f/u in 1 month for BP check and possible adjustment of medication

## 2015-12-01 NOTE — Assessment & Plan Note (Signed)
CBGs still uncontrolled but improvement from OV 2 weeks ago. Fasting CBGs are almost at the level of desired control but still with significantly elevated post-prandial blood sugars.  -continue with current plan of increasing insulin 1 unit for every fasting CBG >130 starting from his achieved dose of Lantus 20 units daily  -increase Metformin from 500 mg BID to 1000 mg BID for better glycemic control  -hypoglycemic precautions given, patient to call clinic if fasting CBGs below 90 on average  -f/u in one month

## 2015-12-14 ENCOUNTER — Other Ambulatory Visit: Payer: BC Managed Care – PPO

## 2015-12-14 DIAGNOSIS — I10 Essential (primary) hypertension: Secondary | ICD-10-CM

## 2015-12-14 LAB — BASIC METABOLIC PANEL
BUN: 19 mg/dL (ref 7–25)
CHLORIDE: 102 mmol/L (ref 98–110)
CO2: 30 mmol/L (ref 20–31)
CREATININE: 0.94 mg/dL (ref 0.70–1.25)
Calcium: 8.9 mg/dL (ref 8.6–10.3)
Glucose, Bld: 98 mg/dL (ref 65–99)
POTASSIUM: 4 mmol/L (ref 3.5–5.3)
Sodium: 139 mmol/L (ref 135–146)

## 2015-12-15 ENCOUNTER — Encounter: Payer: Self-pay | Admitting: *Deleted

## 2015-12-15 NOTE — Progress Notes (Signed)
Letter mailed to patient. Jazmin Hartsell,CMA  

## 2015-12-30 ENCOUNTER — Encounter: Payer: Self-pay | Admitting: Internal Medicine

## 2015-12-30 ENCOUNTER — Ambulatory Visit (INDEPENDENT_AMBULATORY_CARE_PROVIDER_SITE_OTHER): Payer: BC Managed Care – PPO | Admitting: Internal Medicine

## 2015-12-30 VITALS — BP 137/73 | HR 67 | Temp 98.0°F | Ht 72.0 in | Wt 279.2 lb

## 2015-12-30 DIAGNOSIS — Z794 Long term (current) use of insulin: Secondary | ICD-10-CM | POA: Diagnosis not present

## 2015-12-30 DIAGNOSIS — E119 Type 2 diabetes mellitus without complications: Secondary | ICD-10-CM

## 2015-12-30 DIAGNOSIS — E785 Hyperlipidemia, unspecified: Secondary | ICD-10-CM | POA: Diagnosis not present

## 2015-12-30 DIAGNOSIS — I10 Essential (primary) hypertension: Secondary | ICD-10-CM

## 2015-12-30 MED ORDER — ATORVASTATIN CALCIUM 80 MG PO TABS: 80.0000 mg | ORAL_TABLET | Freq: Every day | ORAL | Status: DC

## 2015-12-30 NOTE — Assessment & Plan Note (Addendum)
At goal of <140/90 at today's visit with reported good medication compliance.  -continue HCTZ-lisinopril  -continue with periodic BP checks  -will follow up in 1 month

## 2015-12-30 NOTE — Assessment & Plan Note (Addendum)
CBGs much more controlled at this visit. Fasting CBGs have all been at goal for several weeks.  -continue with Lantus 20 untis, will not titrate given low CBGs throughout the day on 25 units of Lantus  -continue Metformin 1000 mg BID -hypoglycemia precautions reviewed  -f/u in one month; repeat A1C at that visit  -patient to schedule diabetic eye exam  -foot exam at next visit

## 2015-12-30 NOTE — Assessment & Plan Note (Signed)
ASCVD risk calculated to be 34%. Discussed benefits and risks of statin therapy and patient agreeable to trial of therapy.  -start Atorvastatin 80 mg daily  -repeat LDL at next visit (needs repeat within 4-12 weeks)  -discuss ASA therapy at next visit

## 2015-12-30 NOTE — Patient Instructions (Addendum)
Please call Dr. Katy Fitch as soon as possible and ask to have a diabetic eye exam scheduled.   Continue with Lantus 20 units every morning. Again, check your blood sugar if you have any symptoms of low blood sugar.   We are starting Atorvastatin (Lipitor) for high cholesterol. This medication helps to prevent strokes and heart attacks by lowering cholesterol levels.   Please make a follow up appointment with Dr. Juleen China in about 1 month.

## 2015-12-30 NOTE — Progress Notes (Signed)
   Subjective:    Frank Scott - 65 y.o. male MRN [id number]  Date of birth: 12-03-1950  HPI  Frank Scott is here for follow up of chronic medical conditions.   Diabetes mellitus, Type 2 Disease Monitoring Blood Sugar Ranges: Fasting - 90-110; Random - 120-160s. Polyuria: No Visual problems: no   Urine Microalbumin: On AceI   Last A1C: 13.1 (5/17)  Medication Compliance: yes. Got up to Lantus 25 units but was persistently having fastin CBGs <90 so went back down to 20 units of Lantus. Has been on that dose for about 1 week.  Medication Side Effects Hypoglycemia: no   Preventitive Health Care Eye Exam: Patient established with Dr. Katy Fitch but has not had diabetic eye exam yet.  Foot Exam: In need.    Chronic HTN Disease Monitoring:  Home BP Monitoring - Yes. Reports most recent BP around 120/80.  Chest pain- no  Dyspnea- no Headache - no  Medications: HCTZ-Lisinopril  Compliance- yes Lightheadedness- no  Edema- no   HYPERLIPIDEMIA Symptoms Chest pain on exertion:  No    Leg claudication:   No   Medication: Not currently on medical therapy for most recent lipid panel results.   -  reports that he has never smoked. He does not have any smokeless tobacco history on file. - Review of Systems: Per HPI. - Past Medical History: Patient Active Problem List   Diagnosis Date Noted  . Hyperlipidemia 11/16/2015  . Type 2 diabetes mellitus ([place]) 11/05/2015  . Erectile dysfunction 10/15/2015  . HTN (hypertension) 10/04/2012  . Traumatic intracerebral hemorrhage ([place]) 09/07/2012  . OBESITY 11/02/2009   - Medications: reviewed and updated    Objective:   Physical Exam BP 137/73 mmHg  Pulse 67  Temp(Src) 98 F (36.7 C) (Oral)  Ht 6' (1.829 m)  Wt 279 lb 3.2 oz (126.644 kg)  BMI 37.86 kg/m2  SpO2 97% Gen: NAD, alert, cooperative with exam, well-appearing HEENT: NCAT, PERRL,  clear conjunctiva, oropharynx clear, supple neck CV: RRR, good S1/S2, no murmur, no edema, capillary refill brisk  Resp: CTABL, no wheezes, non-labored Psych: good insight, alert and oriented      Assessment & Plan:   HTN (hypertension) At goal of <140/80 at today's visit with reported good medication compliance.  -continue HCTZ-lisinopril  -continue with periodic BP checks  -will follow up in 1 month   Type 2 diabetes mellitus ([place]) CBGs much more controlled at this visit. Fasting CBGs have all been at goal for several weeks.  -continue with Lantus 20 untis, will not titrate given low CBGs throughout the day on 25 units of Lantus  -continue Metformin 1000 mg BID -hypoglycemia precautions reviewed  -f/u in one month; repeat A1C at that visit  -patient to schedule diabetic eye exam  -foot exam at next visit   Hyperlipidemia ASCVD risk calculated to be 34%. Discussed benefits and risks of statin therapy and patient agreeable to trial of therapy.  -start Atorvastatin 80 mg daily  -repeat LDL at next visit (needs repeat within 4-12 weeks)  -discuss ASA therapy at next visit      Phill Myron, D.O. 12/30/2015, 10:31 AM PGY-2, [place]

## 2016-01-21 ENCOUNTER — Other Ambulatory Visit: Payer: Self-pay | Admitting: Internal Medicine

## 2016-01-21 DIAGNOSIS — I10 Essential (primary) hypertension: Secondary | ICD-10-CM

## 2016-03-29 ENCOUNTER — Ambulatory Visit (INDEPENDENT_AMBULATORY_CARE_PROVIDER_SITE_OTHER): Payer: BC Managed Care – PPO | Admitting: Internal Medicine

## 2016-03-29 ENCOUNTER — Encounter: Payer: Self-pay | Admitting: Internal Medicine

## 2016-03-29 VITALS — BP 154/89 | HR 77 | Temp 98.1°F | Ht 72.0 in | Wt 282.0 lb

## 2016-03-29 DIAGNOSIS — E119 Type 2 diabetes mellitus without complications: Secondary | ICD-10-CM

## 2016-03-29 DIAGNOSIS — I10 Essential (primary) hypertension: Secondary | ICD-10-CM

## 2016-03-29 DIAGNOSIS — E785 Hyperlipidemia, unspecified: Secondary | ICD-10-CM

## 2016-03-29 LAB — POCT GLYCOSYLATED HEMOGLOBIN (HGB A1C): Hemoglobin A1C: 6

## 2016-03-29 LAB — LDL CHOLESTEROL, DIRECT: LDL DIRECT: 117 mg/dL (ref <130)

## 2016-03-29 MED ORDER — LISINOPRIL-HYDROCHLOROTHIAZIDE 20-12.5 MG PO TABS: 1.0000 | ORAL_TABLET | Freq: Every day | ORAL | 3 refills | Status: DC

## 2016-03-29 NOTE — Assessment & Plan Note (Signed)
Well controlled with dramatic reduction of A1C from 13.1 to 6.0. Patient has self-reduced insulin regimen from 20 units to 15 units daily.  -return in 3 months for repeat A1c -take Metformin BID as prescribed -hopeful to be able to taper down on insulin at next visit if CBGs still well controlled  -send eye exam results to clinic after appointment

## 2016-03-29 NOTE — Assessment & Plan Note (Signed)
Not at goal of <140/90.  -Increase to Lisinopril-HCTZ 20-12.5 mg  -return in 2 weeks for nurse check of BP  -call clinic if home BPs >140/90

## 2016-03-29 NOTE — Patient Instructions (Signed)
Take your Metformin twice per day. Please return in 3 months for another follow up appointment. We may be able to start decreasing the amount of insulin you are taking if you still have good blood sugar control. Please work on exercising and making healthy eating choices.   I have sent in a higher dose of your blood pressure medication. It is still a combo pill. Please make a nurse visit in 2 weeks for a blood pressure check. Please call me if your home numbers are consistently >140/90.  Good to see you today!   Dr. Juleen China

## 2016-03-29 NOTE — Assessment & Plan Note (Addendum)
Patient reports irregular compliance with medication. Discussed importance of daily adherence given ASCVD risk score  -repeat LDL to monitor for improvement  -consider ASA therapy

## 2016-03-29 NOTE — Progress Notes (Signed)
   Subjective:    Frank Scott - 65 y.o. male MRN [id number]  Date of birth: April 20, 1951  HPI  Frank Scott is here for chronic medical conditions.  Diabetes mellitus, Type 2 Disease Monitoring Blood Sugar Ranges: Fasting - 115-140; has not been checking random  Polyuria: No  Visual problems: no   Urine Microalbumin: On Ace Therapy   Last A1C: 13.1 (5/17)  Medication Compliance: yes, Lantus 15 units daily and Metformin 1000 mg daily (Rx for BID)  Medication Side Effects Hypoglycemia: no   Preventitive Health Care Eye Exam: Has eye exam scheduled Dr. Katy Fitch  Foot Exam: Performed today    Chronic HTN Disease Monitoring:  Home BP Monitoring - Yes, most recent: 140/90 and 135/75  Chest pain- no  Dyspnea- no Headache - no  Medications: Lisinopril-HCTZ 10-12.5 mg  Compliance- yes Lightheadedness- no  Edema- no   HYPERLIPIDEMIA Medication: Atorvastatin 80 mg, takes only about 3 days per week   Symptoms Chest pain on exertion:  No    Leg claudication:   No  Myalgias: No  RUQ pain: No    Health Maintenance Due  Topic Date Due  . Hepatitis C Screening  [birth year]  . FOOT EXAM  11/30/1961  . OPHTHALMOLOGY EXAM  11/30/1961  . HIV Screening  12/01/1966  . COLONOSCOPY  11/30/2001  . ZOSTAVAX  12/01/2011    -  reports that he has never smoked. He does not have any smokeless tobacco history on file. - Review of Systems: Per HPI. - Past Medical History: Patient Active Problem List   Diagnosis Date Noted  . Hyperlipidemia 11/16/2015  . Type 2 diabetes mellitus ([place]) 11/05/2015  . Erectile dysfunction 10/15/2015  . HTN (hypertension) 10/04/2012  . Traumatic intracerebral hemorrhage ([place]) 09/07/2012  . OBESITY 11/02/2009   - Medications: reviewed and updated    Objective:   Physical Exam BP (!) 154/89   Pulse 77   Temp 98.1 F (36.7 C) (Oral)   Ht 6' (1.829 m)   Wt 282 lb  (127.9 kg)   BMI 38.25 kg/m  Gen: NAD, alert, cooperative with exam, well-appearing CV: RRR, good S1/S2, no murmur, no edema, capillary refill brisk  Resp: CTABL, no wheezes, non-labored  Diabetic Foot Check -  Appearance - no lesions, ulcers or calluses Skin - no unusual pallor or redness Monofilament testing - normal bilaterally  Right - Great toe, medial, central, lateral ball and posterior foot intact Left - Great toe, medial, central, lateral ball and posterior foot intact     Assessment & Plan:   HTN (hypertension) Not at goal of <140/90.  -Increase to Lisinopril-HCTZ 20-12.5 mg  -return in 2 weeks for nurse check of BP  -call clinic if home BPs >140/90   Type 2 diabetes mellitus ([place]) Well controlled with dramatic reduction of A1C from 13.1 to 6.0. Patient has self-reduced insulin regimen from 20 units to 15 units daily.  -return in 3 months for repeat A1c -take Metformin BID as prescribed -hopeful to be able to taper down on insulin at next visit if CBGs still well controlled  -send eye exam results to clinic after appointment   Hyperlipidemia Patient reports irregular compliance with medication. Discussed importance of daily adherence given ASCVD risk score  -repeat LDL to monitor for improvement  -consider ASA therapy     Phill Myron, D.O. 03/29/2016, 2:58 PM PGY-2, [place]

## 2016-04-01 ENCOUNTER — Telehealth: Payer: Self-pay

## 2016-04-01 NOTE — Telephone Encounter (Signed)
-----   Message from Nicolette Bang, DO sent at 04/01/2016  9:39 AM EDT ----- LDL has improved from 169 to 117. Please call patient with these results and encourage daily statin use to further lower the bad cholesterol which has been linked to heart attacks and strokes.   Phill Myron, D.O. 04/01/2016, 9:39 AM PGY-2, [place]

## 2016-04-01 NOTE — Telephone Encounter (Signed)
Called pt, phone rang with no option to leave a message. Ottis Stain, CMA

## 2016-04-06 ENCOUNTER — Telehealth: Payer: Self-pay

## 2016-04-06 NOTE — Telephone Encounter (Signed)
-----   Message from Nicolette Bang, DO sent at 04/01/2016  9:39 AM EDT ----- LDL has improved from 169 to 117. Please call patient with these results and encourage daily statin use to further lower the bad cholesterol which has been linked to heart attacks and strokes.   Phill Myron, D.O. 04/01/2016, 9:39 AM PGY-2, [place]

## 2016-04-06 NOTE — Telephone Encounter (Signed)
Tried to call pt. No option to LVM. Ottis Stain, CMA

## 2016-05-12 LAB — HM DIABETES EYE EXAM

## 2016-05-28 ENCOUNTER — Other Ambulatory Visit: Payer: Self-pay | Admitting: Internal Medicine

## 2016-05-28 DIAGNOSIS — E785 Hyperlipidemia, unspecified: Secondary | ICD-10-CM

## 2016-09-29 ENCOUNTER — Other Ambulatory Visit: Payer: Self-pay | Admitting: Internal Medicine

## 2016-09-29 DIAGNOSIS — E119 Type 2 diabetes mellitus without complications: Secondary | ICD-10-CM

## 2016-12-23 ENCOUNTER — Other Ambulatory Visit: Payer: Self-pay | Admitting: Internal Medicine

## 2016-12-23 DIAGNOSIS — E119 Type 2 diabetes mellitus without complications: Secondary | ICD-10-CM

## 2017-02-06 ENCOUNTER — Other Ambulatory Visit: Payer: Self-pay | Admitting: Internal Medicine

## 2017-02-06 DIAGNOSIS — E119 Type 2 diabetes mellitus without complications: Secondary | ICD-10-CM

## 2017-02-06 DIAGNOSIS — Z794 Long term (current) use of insulin: Principal | ICD-10-CM

## 2017-03-08 ENCOUNTER — Encounter: Payer: Self-pay | Admitting: Internal Medicine

## 2017-03-08 ENCOUNTER — Ambulatory Visit (INDEPENDENT_AMBULATORY_CARE_PROVIDER_SITE_OTHER): Payer: BC Managed Care – PPO | Admitting: Internal Medicine

## 2017-03-08 VITALS — BP 122/80 | HR 78 | Temp 98.3°F | Ht 72.0 in | Wt 287.0 lb

## 2017-03-08 DIAGNOSIS — N529 Male erectile dysfunction, unspecified: Secondary | ICD-10-CM | POA: Diagnosis not present

## 2017-03-08 DIAGNOSIS — E119 Type 2 diabetes mellitus without complications: Secondary | ICD-10-CM | POA: Diagnosis not present

## 2017-03-08 DIAGNOSIS — I10 Essential (primary) hypertension: Secondary | ICD-10-CM

## 2017-03-08 DIAGNOSIS — E785 Hyperlipidemia, unspecified: Secondary | ICD-10-CM

## 2017-03-08 LAB — POCT GLYCOSYLATED HEMOGLOBIN (HGB A1C): Hemoglobin A1C: 7.5

## 2017-03-08 MED ORDER — LISINOPRIL 5 MG PO TABS: 5.0000 mg | ORAL_TABLET | Freq: Every day | ORAL | 3 refills | Status: DC

## 2017-03-08 MED ORDER — TADALAFIL 10 MG PO TABS: 10.0000 mg | ORAL_TABLET | Freq: Every day | ORAL | 3 refills | Status: DC | PRN

## 2017-03-08 NOTE — Patient Instructions (Signed)
I will call you with your lab results and we will discus 10 year risk of stroke/heart attack. I will give you recommendations about restarting Atorvastatin (Lipitor) at that point.   I would strongly recommend resuming Metformin 1000 mg twice per day. I have sent in Lisinopril which is kidney protective with your diabetes.

## 2017-03-08 NOTE — Assessment & Plan Note (Signed)
Will calculate ASCVD risk score with updated lipid panel results and make medication recommendations based on results.  - Lipid Panel

## 2017-03-08 NOTE — Assessment & Plan Note (Signed)
A1c worsened from 6.0 to 7.5 today. Patient has only been taking Metformin 1000 mg once per day at the most instead of prescribed BID dosing due to fear that it is causing AE of ED. Discussed that Metformin use not linked to ED. Patient believes he would be able to comply to BID dosing and declines ER version of medication. Will have patient return in 3 months for repeat A1c.  - POCT glycosylated hemoglobin (Hb A1C) - lisinopril (PRINIVIL,ZESTRIL) 5 MG tablet; Take 1 tablet (5 mg total) by mouth daily.  Dispense: 90 tablet; Refill: 3

## 2017-03-08 NOTE — Assessment & Plan Note (Signed)
Controlled today and at home without medications. Will prescribe Lisinopril for renal protection in setting of T2DM.  - Basic Metabolic Panel - lisinopril (PRINIVIL,ZESTRIL) 5 MG tablet; Take 1 tablet (5 mg total) by mouth daily.  Dispense: 90 tablet; Refill: 3

## 2017-03-08 NOTE — Assessment & Plan Note (Signed)
Will prescribe Cialis. Patient is not taking nitrites and has normal kidney function on last years labs so does not require dose adjustment. Counseled on potential AEs. Still has AM erections so less likely vascular.  - tadalafil (CIALIS) 10 MG tablet; Take 1 tablet (10 mg total) by mouth daily as needed for erectile dysfunction.  Dispense: 30 tablet; Refill: 3

## 2017-03-08 NOTE — Progress Notes (Signed)
Subjective:    DEMETRY BENDICKSON - 66 y.o. male MRN [id number]  Date of birth: [birth year]  HPI  JAVONE YBANEZ is here for follow up of chronic medical conditions.   Diabetes mellitus, Type 2 Disease Monitoring Blood Sugar Ranges: Not checking  Polyuria: No  Visual problems: no   Urine Microalbumin Was on Ace-Inhibitor therapy   Last A1C: 6.0 (Oct 2017)   Medication Compliance: no, taking Metformin 1000 mg once per day, no insulin therapy currently  Medication Side Effects Hypoglycemia: no   Preventitive Health Care Eye Exam: UTD Foot Exam: UTD  Chronic HTN Disease Monitoring:  Home BP Monitoring - wife checks 1-2/week; average 120-130/70-80  Chest pain- no  Dyspnea- no Headache - no  Medications: Lisinopril-HCTZ 20-12.5 mg; not consistently compliant  Compliance- no Lightheadedness- no  Edema- no   Erectile Dysfunction: Reports problems with achieving erection and maintaining erection once achieved. No premature ejaculation. Still has AM erections. Has Rx for Viagra but has not tried due to expense. No nitrite use. Never smoker.   HLD: Patient reports he is not taking Atorvastatin.    -  reports that he has never smoked. He has never used smokeless tobacco. - Review of Systems: Per HPI. - Past Medical History: Patient Active Problem List   Diagnosis Date Noted  . Hyperlipidemia 11/16/2015  . Type 2 diabetes mellitus ([place]) 11/05/2015  . Erectile dysfunction 10/15/2015  . HTN (hypertension) 10/04/2012  . Traumatic intracerebral hemorrhage ([place]) 09/07/2012  . OBESITY 11/02/2009   - Medications: reviewed and updated   Objective:   Physical Exam BP 122/80   Pulse 78   Temp 98.3 F (36.8 C) (Oral)   Ht 6' (1.829 m)   Wt 287 lb (130.2 kg)   SpO2 98%   BMI 38.92 kg/m  Gen: NAD, alert, cooperative with exam, well-appearing HEENT: NCAT, PERRL, clear conjunctiva, oropharynx  clear, supple neck CV: RRR, good S1/S2, no murmur, no edema, capillary refill brisk  Resp: CTABL, no wheezes, non-labored Abd: SNTND, BS present, no guarding or organomegaly Skin: no rashes, normal turgor  Neuro: no gross deficits.  Psych: good insight, alert and oriented    Assessment & Plan:   1. Type 2 diabetes mellitus without complication, without long-term current use of insulin (HCC) A1c worsened from 6.0 to 7.5 today. Patient has only been taking Metformin 1000 mg once per day at the most instead of prescribed BID dosing due to fear that it is causing AE of ED. Discussed that Metformin use not linked to ED. Patient believes he would be able to comply to BID dosing and declines ER version of medication. Will have patient return in 3 months for repeat A1c.  - POCT glycosylated hemoglobin (Hb A1C) - lisinopril (PRINIVIL,ZESTRIL) 5 MG tablet; Take 1 tablet (5 mg total) by mouth daily.  Dispense: 90 tablet; Refill: 3  2. Essential hypertension Controlled today and at home without medications. Will prescribe Lisinopril for renal protection in setting of T2DM.  - Basic Metabolic Panel - lisinopril (PRINIVIL,ZESTRIL) 5 MG tablet; Take 1 tablet (5 mg total) by mouth daily.  Dispense: 90 tablet; Refill: 3  3. Hyperlipidemia, unspecified hyperlipidemia type Will calculate ASCVD risk score with updated lipid panel results and make medication recommendations based on results.  - Lipid Panel  4. Erectile dysfunction, unspecified erectile dysfunction type Will prescribe Cialis. Patient is not taking nitrites and has normal kidney function on last years labs so does not require dose adjustment. Counseled on potential AEs.  -  tadalafil (CIALIS) 10 MG tablet; Take 1 tablet (10 mg total) by mouth daily as needed for erectile dysfunction.  Dispense: 30 tablet; Refill: [place], D.O. 03/08/2017, 2:35 PM PGY-3, [place]

## 2017-03-09 ENCOUNTER — Telehealth: Payer: Self-pay | Admitting: Internal Medicine

## 2017-03-09 DIAGNOSIS — E785 Hyperlipidemia, unspecified: Secondary | ICD-10-CM

## 2017-03-09 LAB — LIPID PANEL
CHOLESTEROL TOTAL: 247 mg/dL — AB (ref 100–199)
Chol/HDL Ratio: 5.7 ratio — ABNORMAL HIGH (ref 0.0–5.0)
HDL: 43 mg/dL (ref >39)
LDL Calculated: 162 mg/dL — ABNORMAL HIGH (ref 0–99)
Triglycerides: 212 mg/dL — ABNORMAL HIGH (ref 0–149)
VLDL Cholesterol Cal: 42 mg/dL — ABNORMAL HIGH (ref 5–40)

## 2017-03-09 LAB — BASIC METABOLIC PANEL
BUN / CREAT RATIO: 15 (ref 10–24)
BUN: 16 mg/dL (ref 8–27)
CHLORIDE: 98 mmol/L (ref 96–106)
CO2: 25 mmol/L (ref 20–29)
Calcium: 9.1 mg/dL (ref 8.6–10.2)
Creatinine, Ser: 1.1 mg/dL (ref 0.76–1.27)
GFR calc Af Amer: 81 mL/min/{1.73_m2} (ref >59)
GFR calc non Af Amer: 70 mL/min/{1.73_m2} (ref >59)
GLUCOSE: 203 mg/dL — AB (ref 65–99)
Potassium: 4.9 mmol/L (ref 3.5–5.2)
Sodium: 137 mmol/L (ref 134–144)

## 2017-03-09 MED ORDER — ASPIRIN EC 81 MG PO TBEC: 81.0000 mg | DELAYED_RELEASE_TABLET | Freq: Every day | ORAL | 3 refills | Status: DC

## 2017-03-09 MED ORDER — ATORVASTATIN CALCIUM 80 MG PO TABS: 80.0000 mg | ORAL_TABLET | Freq: Every day | ORAL | 3 refills | Status: DC

## 2017-03-09 NOTE — Telephone Encounter (Signed)
ASCVD 10 year risk calculated to be 29%. Would recommend starting daily baby aspirin (81 mg) and resuming his Atorvastatin (Lipitor). I have sent in Rx for both medications. I attempted to call patient with these results. Left VM asking him to return my call.   Phill Myron, D.O. 03/09/2017, 10:41 AM PGY-3, [place]

## 2017-04-14 ENCOUNTER — Other Ambulatory Visit: Payer: Self-pay | Admitting: Internal Medicine

## 2017-04-14 DIAGNOSIS — E119 Type 2 diabetes mellitus without complications: Secondary | ICD-10-CM

## 2017-06-21 ENCOUNTER — Other Ambulatory Visit: Payer: Self-pay | Admitting: Internal Medicine

## 2017-06-21 DIAGNOSIS — N529 Male erectile dysfunction, unspecified: Secondary | ICD-10-CM

## 2017-06-21 MED ORDER — TADALAFIL 10 MG PO TABS: 10.0000 mg | ORAL_TABLET | Freq: Every day | ORAL | 3 refills | Status: DC | PRN

## 2017-06-22 ENCOUNTER — Other Ambulatory Visit: Payer: Self-pay | Admitting: Internal Medicine

## 2017-06-22 MED ORDER — SILDENAFIL CITRATE 100 MG PO TABS: 50.0000 mg | ORAL_TABLET | Freq: Every day | ORAL | 0 refills | Status: DC | PRN

## 2017-06-30 ENCOUNTER — Other Ambulatory Visit: Payer: Self-pay

## 2017-06-30 ENCOUNTER — Ambulatory Visit (HOSPITAL_COMMUNITY)
Admission: EM | Admit: 2017-06-30 | Discharge: 2017-06-30 | Disposition: A | Discharge status: Home / Self Care | Payer: Medicare Other | Attending: Family Medicine | Admitting: Family Medicine

## 2017-06-30 ENCOUNTER — Encounter (HOSPITAL_COMMUNITY): Payer: Self-pay | Admitting: Emergency Medicine

## 2017-06-30 DIAGNOSIS — B349 Viral infection, unspecified: Secondary | ICD-10-CM | POA: Diagnosis not present

## 2017-06-30 MED ORDER — CETIRIZINE-PSEUDOEPHEDRINE ER 5-120 MG PO TB12: 1.0000 | ORAL_TABLET | Freq: Every day | ORAL | 0 refills | Status: DC

## 2017-06-30 MED ORDER — HYDROCOD POLST-CPM POLST ER 10-8 MG/5ML PO SUER: 5.0000 mL | Freq: Every evening | ORAL | 0 refills | Status: DC | PRN

## 2017-06-30 MED ORDER — FLUTICASONE PROPIONATE 50 MCG/ACT NA SUSP: 2.0000 | Freq: Every day | NASAL | 0 refills | Status: DC

## 2017-06-30 MED ORDER — BENZONATATE 100 MG PO CAPS: 100.0000 mg | ORAL_CAPSULE | Freq: Three times a day (TID) | ORAL | 0 refills | Status: DC

## 2017-06-30 NOTE — Discharge Instructions (Signed)
Tessalon for cough. Tussionex at night for cough. Start flonase, zyrtec-D for nasal congestion. You can use over the counter nasal saline rinse such as neti pot for nasal congestion. Keep hydrated, your urine should be clear to pale yellow in color. Tylenol/motrin for fever and pain. Monitor for any worsening of symptoms, chest pain, shortness of breath, wheezing, swelling of the throat, follow up for reevaluation.   For sore throat try using a honey-based tea. Use 3 teaspoons of honey with juice squeezed from half lemon. Place shaved pieces of ginger into 1/2-1 cup of water and warm over stove top. Then mix the ingredients and repeat every 4 hours as needed.

## 2017-06-30 NOTE — ED Provider Notes (Signed)
[place]    CSN: [id number] Arrival date & time: 06/30/17  1010     History   Chief Complaint Chief Complaint  Patient presents with  . URI    HPI Frank Scott is a 67 y.o. male.   67 year old male with history of DM, HTN comes in for 1 week history of URI symptoms. Productive cough, nasal congestion, rhinorrhea. Denies ear pain. States had sore throat that has since resolved.  States cough is worse at night, and has been unable to sleep.  Denies fever, chills, night sweats. otc mucinex with good relief.  Denies chest pain, shortness of breath, wheezing.  Positive sick contact.  Never smoker.      Past Medical History:  Diagnosis Date  . DVT of deep femoral vein ([place]) 1980s  . Hypertension     Patient Active Problem List   Diagnosis Date Noted  . Hyperlipidemia 11/16/2015  . Type 2 diabetes mellitus ([place]) 11/05/2015  . Erectile dysfunction 10/15/2015  . HTN (hypertension) 10/04/2012  . Traumatic intracerebral hemorrhage ([place]) 09/07/2012  . OBESITY 11/02/2009    Past Surgical History:  Procedure Laterality Date  . APPENDECTOMY  1980s  . CRANIOTOMY Right 09/07/2012   Procedure: Elevation of Decompressed Skull Charolette Forward;  Surgeon: Faythe Ghee, MD;  Location: [facility];  Service: Neurosurgery;  Laterality: Right;  Elevation of Right Fronto-Temporal Skull Fracture       Home Medications    Prior to Admission medications   Medication Sig Start Date End Date Taking? Authorizing Provider  aspirin EC 81 MG tablet Take 1 tablet (81 mg total) by mouth daily. 03/09/17  Yes Nicolette Bang, DO  atorvastatin (LIPITOR) 80 MG tablet Take 1 tablet (80 mg total) by mouth daily. 03/09/17  Yes Nicolette Bang, DO  Blood Glucose Monitoring Suppl (ONE TOUCH ULTRA 2) w/Device KIT Check blood sugar fasting in the morning and one other time two hours after a meal. 11/04/15  Yes Nicolette Bang, DO  lisinopril (PRINIVIL,ZESTRIL) 5 MG  tablet Take 1 tablet (5 mg total) by mouth daily. 03/08/17  Yes Nicolette Bang, DO  metFORMIN (GLUCOPHAGE) 1000 MG tablet TAKE 1 TABLET BY MOUTH TWICE DAILY WITH A MEAL 02/06/17  Yes Nicolette Bang, DO  ONE TOUCH ULTRA TEST test strip CHECK BLOOD SUGAR FASTING IN THE MORNING AND ONE OTHER TIME 2 HOURS AFTER A MEAL 12/23/16  Yes Nicolette Bang, DO  ONETOUCH DELICA LANCETS 50Y MISC CHECK BLOOD SUGAR FASTING IN THE MORNING AND ONE OTHER TIME 2 HOURS AFTER A MEAL 04/14/17  Yes Nicolette Bang, DO  benzonatate (TESSALON) 100 MG capsule Take 1 capsule (100 mg total) by mouth every 8 (eight) hours. 06/30/17   Tasia Catchings, Issabela Lesko V, PA-C  cetirizine-pseudoephedrine (ZYRTEC-D) 5-120 MG tablet Take 1 tablet by mouth daily. 06/30/17   Tasia Catchings, Zarif Rathje V, PA-C  chlorpheniramine-HYDROcodone (TUSSIONEX PENNKINETIC ER) 10-8 MG/5ML SUER Take 5 mLs by mouth at bedtime as needed for cough. 06/30/17   Ok Edwards, PA-C  Docusate Sodium (DSS) 100 MG CAPS Take 100 mg by mouth 2 (two) times daily as needed (for constipation). 09/14/12   White, Elizabeth A, PA-C  fluticasone (FLONASE) 50 MCG/ACT nasal spray Place 2 sprays into both nostrils daily. 06/30/17   Tasia Catchings, Lamarkus Nebel V, PA-C  polyethylene glycol (MIRALAX / GLYCOLAX) packet Take 17 g by mouth daily as needed (for constipation). 09/14/12   White, Elizabeth A, PA-C  sildenafil (VIAGRA) 100 MG tablet Take 0.5-1 tablets (50-100  mg total) by mouth daily as needed for erectile dysfunction. 06/22/17   Mayo, Pete Pelt, MD  traMADol (ULTRAM) 50 MG tablet Take 50-100 mg by mouth every 6 (six) hours as needed for pain. 09/14/12   Iona Hansen, PA-C    Family History Family History  Problem Relation Age of Onset  . Deep vein thrombosis Daughter   . Clotting disorder Daughter   . Deep vein thrombosis Son   . Diabetes Mother   . Deep vein thrombosis Sister   . Deep vein thrombosis Brother     Social History Social History   Tobacco Use  . Smoking status: Never  Smoker  . Smokeless tobacco: Never Used  Substance Use Topics  . Alcohol use: Yes    Alcohol/week: 0.0 oz    Comment: rarely  . Drug use: No     Allergies   Patient has no known allergies.   Review of Systems Review of Systems  Reason unable to perform ROS: See HPI as above.     Physical Exam Triage Vital Signs ED Triage Vitals [06/30/17 1116]  Enc Vitals Group     BP (!) 153/64     Pulse Rate 74     Resp      Temp 98.2 F (36.8 C)     Temp Source Oral     SpO2 99 %     Weight      Height      Head Circumference      Peak Flow      Pain Score      Pain Loc      Pain Edu?      Excl. in [place]?    No data found.  Updated Vital Signs BP (!) 153/64 (BP Location: Left Arm)   Pulse 74   Temp 98.2 F (36.8 C) (Oral)   SpO2 99%   Physical Exam  Constitutional: He is oriented to person, place, and time. He appears well-developed and well-nourished. No distress.  HENT:  Head: Normocephalic and atraumatic.  Right Ear: External ear and ear canal normal. Tympanic membrane is erythematous. Tympanic membrane is not bulging.  Left Ear: External ear and ear canal normal. Tympanic membrane is erythematous. Tympanic membrane is not bulging.  Nose: Mucosal edema and rhinorrhea present. Right sinus exhibits no maxillary sinus tenderness and no frontal sinus tenderness. Left sinus exhibits no maxillary sinus tenderness and no frontal sinus tenderness.  Mouth/Throat: Uvula is midline, oropharynx is clear and moist and mucous membranes are normal.  Eyes: Conjunctivae are normal. Pupils are equal, round, and reactive to light.  Neck: Normal range of motion. Neck supple.  Cardiovascular: Normal rate, regular rhythm and normal heart sounds. Exam reveals no gallop and no friction rub.  No murmur heard. Pulmonary/Chest: Effort normal and breath sounds normal. He has no decreased breath sounds. He has no wheezes. He has no rhonchi. He has no rales.  Lymphadenopathy:    He has no cervical  adenopathy.  Neurological: He is alert and oriented to person, place, and time.  Skin: Skin is warm and dry.  Psychiatric: He has a normal mood and affect. His behavior is normal. Judgment normal.     UC Treatments / Results  Labs (all labs ordered are listed, but only abnormal results are displayed) Labs Reviewed - No data to display  EKG  EKG Interpretation None       Radiology No results found.  Procedures Procedures (including critical care time)  Medications Ordered in  UC Medications - No data to display   Initial Impression / Assessment and Plan / UC Course  I have reviewed the triage vital signs and the nursing notes.  Pertinent labs & imaging results that were available during my care of the patient were reviewed by me and considered in my medical decision making (see chart for details).    Discussed with patient history and exam most consistent with viral URI. Symptomatic treatment as needed. Push fluids. Return precautions given.   Final Clinical Impressions(s) / UC Diagnoses   Final diagnoses:  Viral syndrome    ED Discharge Orders        Ordered    benzonatate (TESSALON) 100 MG capsule  Every 8 hours     06/30/17 1202    fluticasone (FLONASE) 50 MCG/ACT nasal spray  Daily     06/30/17 1202    cetirizine-pseudoephedrine (ZYRTEC-D) 5-120 MG tablet  Daily     06/30/17 1202    chlorpheniramine-HYDROcodone (TUSSIONEX PENNKINETIC ER) 10-8 MG/5ML SUER  At bedtime PRN     06/30/17 1202       Controlled Substance Prescriptions [place] Controlled Substance Registry consulted? Yes, I have consulted the [place] Controlled Substances Registry for this patient, and feel the risk/benefit ratio today is favorable for proceeding with this prescription for a controlled substance.   Ok Edwards, PA-C 06/30/17 1206

## 2017-06-30 NOTE — ED Triage Notes (Signed)
Pt has been suffering from a cough and nasal congestion for one week.

## 2017-09-06 DIAGNOSIS — E119 Type 2 diabetes mellitus without complications: Secondary | ICD-10-CM | POA: Diagnosis not present

## 2017-09-06 DIAGNOSIS — H40013 Open angle with borderline findings, low risk, bilateral: Secondary | ICD-10-CM | POA: Diagnosis not present

## 2017-09-06 DIAGNOSIS — H25813 Combined forms of age-related cataract, bilateral: Secondary | ICD-10-CM | POA: Diagnosis not present

## 2017-09-06 LAB — HM DIABETES EYE EXAM

## 2017-09-24 ENCOUNTER — Other Ambulatory Visit: Payer: Self-pay | Admitting: Internal Medicine

## 2017-09-24 DIAGNOSIS — E785 Hyperlipidemia, unspecified: Secondary | ICD-10-CM

## 2017-10-19 ENCOUNTER — Encounter: Payer: Self-pay | Admitting: Internal Medicine

## 2017-10-31 ENCOUNTER — Other Ambulatory Visit: Payer: Self-pay

## 2017-10-31 DIAGNOSIS — E785 Hyperlipidemia, unspecified: Secondary | ICD-10-CM

## 2017-11-01 MED ORDER — ATORVASTATIN CALCIUM 80 MG PO TABS: 80.0000 mg | ORAL_TABLET | Freq: Every day | ORAL | 3 refills | Status: DC

## 2018-03-09 ENCOUNTER — Other Ambulatory Visit: Payer: Self-pay

## 2018-03-09 DIAGNOSIS — I10 Essential (primary) hypertension: Secondary | ICD-10-CM

## 2018-03-09 DIAGNOSIS — E119 Type 2 diabetes mellitus without complications: Secondary | ICD-10-CM

## 2018-03-09 MED ORDER — ASPIRIN EC 81 MG PO TBEC: 81.0000 mg | DELAYED_RELEASE_TABLET | Freq: Every day | ORAL | 3 refills | Status: DC

## 2018-03-09 MED ORDER — LISINOPRIL 5 MG PO TABS: 5.0000 mg | ORAL_TABLET | Freq: Every day | ORAL | 3 refills | Status: DC

## 2018-10-24 ENCOUNTER — Other Ambulatory Visit: Payer: Self-pay | Admitting: Internal Medicine

## 2018-12-11 ENCOUNTER — Other Ambulatory Visit: Payer: Self-pay | Admitting: *Deleted

## 2018-12-11 DIAGNOSIS — E785 Hyperlipidemia, unspecified: Secondary | ICD-10-CM

## 2018-12-13 MED ORDER — ATORVASTATIN CALCIUM 80 MG PO TABS: 80.0000 mg | ORAL_TABLET | Freq: Every day | ORAL | 3 refills | Status: DC

## 2019-03-04 ENCOUNTER — Other Ambulatory Visit: Payer: Self-pay | Admitting: Family Medicine

## 2019-03-22 ENCOUNTER — Ambulatory Visit (INDEPENDENT_AMBULATORY_CARE_PROVIDER_SITE_OTHER): Payer: Medicare Other | Admitting: *Deleted

## 2019-03-22 ENCOUNTER — Other Ambulatory Visit: Payer: Self-pay

## 2019-03-22 DIAGNOSIS — Z23 Encounter for immunization: Secondary | ICD-10-CM | POA: Diagnosis not present

## 2019-03-28 ENCOUNTER — Telehealth: Payer: Self-pay | Admitting: *Deleted

## 2019-03-28 NOTE — Telephone Encounter (Signed)
Refill request from one touch ultra test strips.  Unable to reorder in Epic, to PCP. Christen Bame, CMA

## 2019-04-03 ENCOUNTER — Other Ambulatory Visit: Payer: Self-pay | Admitting: Family Medicine

## 2019-04-03 DIAGNOSIS — E119 Type 2 diabetes mellitus without complications: Secondary | ICD-10-CM

## 2019-04-03 MED ORDER — CVS GLUCOSE METER TEST STRIPS VI STRP: ORAL_STRIP | 12 refills | Status: DC

## 2019-04-03 NOTE — Progress Notes (Signed)
Patient called and LVM on nursing line requesting glucose test strips. Sent via epic.

## 2019-04-08 ENCOUNTER — Encounter (HOSPITAL_COMMUNITY): Payer: Self-pay

## 2019-04-08 ENCOUNTER — Encounter (HOSPITAL_COMMUNITY): Payer: Self-pay | Admitting: Emergency Medicine

## 2019-04-08 ENCOUNTER — Ambulatory Visit (INDEPENDENT_AMBULATORY_CARE_PROVIDER_SITE_OTHER)
Admission: EM | Admit: 2019-04-08 | Discharge: 2019-04-08 | Disposition: A | Discharge status: Home / Self Care | Payer: Medicare Other | Source: Home / Self Care

## 2019-04-08 ENCOUNTER — Ambulatory Visit (HOSPITAL_BASED_OUTPATIENT_CLINIC_OR_DEPARTMENT_OTHER)
Admission: RE | Admit: 2019-04-08 | Discharge: 2019-04-08 | Disposition: A | Discharge status: Home / Self Care | Payer: Medicare Other | Source: Ambulatory Visit

## 2019-04-08 ENCOUNTER — Other Ambulatory Visit: Payer: Self-pay

## 2019-04-08 ENCOUNTER — Emergency Department (HOSPITAL_COMMUNITY)
Admission: EM | Admit: 2019-04-08 | Discharge: 2019-04-08 | Disposition: A | Discharge status: Home / Self Care | Payer: Medicare Other | Attending: Emergency Medicine | Admitting: Emergency Medicine

## 2019-04-08 DIAGNOSIS — M79604 Pain in right leg: Secondary | ICD-10-CM | POA: Diagnosis not present

## 2019-04-08 DIAGNOSIS — Z86718 Personal history of other venous thrombosis and embolism: Secondary | ICD-10-CM

## 2019-04-08 DIAGNOSIS — R52 Pain, unspecified: Secondary | ICD-10-CM | POA: Diagnosis not present

## 2019-04-08 DIAGNOSIS — Z79899 Other long term (current) drug therapy: Secondary | ICD-10-CM | POA: Insufficient documentation

## 2019-04-08 DIAGNOSIS — E119 Type 2 diabetes mellitus without complications: Secondary | ICD-10-CM | POA: Insufficient documentation

## 2019-04-08 DIAGNOSIS — I824Y3 Acute embolism and thrombosis of unspecified deep veins of proximal lower extremity, bilateral: Secondary | ICD-10-CM | POA: Diagnosis not present

## 2019-04-08 DIAGNOSIS — Z7982 Long term (current) use of aspirin: Secondary | ICD-10-CM | POA: Insufficient documentation

## 2019-04-08 DIAGNOSIS — I824Y1 Acute embolism and thrombosis of unspecified deep veins of right proximal lower extremity: Secondary | ICD-10-CM

## 2019-04-08 DIAGNOSIS — I10 Essential (primary) hypertension: Secondary | ICD-10-CM | POA: Diagnosis not present

## 2019-04-08 DIAGNOSIS — Z7984 Long term (current) use of oral hypoglycemic drugs: Secondary | ICD-10-CM | POA: Diagnosis not present

## 2019-04-08 DIAGNOSIS — M79651 Pain in right thigh: Secondary | ICD-10-CM | POA: Diagnosis present

## 2019-04-08 DIAGNOSIS — I824Y2 Acute embolism and thrombosis of unspecified deep veins of left proximal lower extremity: Secondary | ICD-10-CM

## 2019-04-08 LAB — BASIC METABOLIC PANEL
Anion gap: 7 (ref 5–15)
BUN: 15 mg/dL (ref 8–23)
CO2: 29 mmol/L (ref 22–32)
Calcium: 9.2 mg/dL (ref 8.9–10.3)
Chloride: 100 mmol/L (ref 98–111)
Creatinine, Ser: 1.13 mg/dL (ref 0.61–1.24)
GFR calc Af Amer: >60 mL/min (ref >60)
GFR calc non Af Amer: >60 mL/min (ref >60)
Glucose, Bld: 307 mg/dL — ABNORMAL HIGH (ref 70–99)
Potassium: 4.6 mmol/L (ref 3.5–5.1)
Sodium: 136 mmol/L (ref 135–145)

## 2019-04-08 LAB — CBC
HCT: 43 % (ref 39.0–52.0)
Hemoglobin: 13.7 g/dL (ref 13.0–17.0)
MCH: 27.8 pg (ref 26.0–34.0)
MCHC: 31.9 g/dL (ref 30.0–36.0)
MCV: 87.4 fL (ref 80.0–100.0)
Platelets: 175 10*3/uL (ref 150–400)
RBC: 4.92 MIL/uL (ref 4.22–5.81)
RDW: 12.3 % (ref 11.5–15.5)
WBC: 6.6 10*3/uL (ref 4.0–10.5)
nRBC: 0 % (ref 0.0–0.2)

## 2019-04-08 MED ORDER — APIXABAN 5 MG PO TABS
10.0000 mg | ORAL_TABLET | Freq: Once | ORAL | Status: AC
  Administered 2019-04-08: 10 mg via ORAL
  Filled 2019-04-08: qty 2

## 2019-04-08 MED ORDER — APIXABAN 5 MG PO TABS: ORAL_TABLET | ORAL | 0 refills | Status: DC

## 2019-04-08 MED ORDER — APIXABAN (ELIQUIS) EDUCATION KIT FOR DVT/PE PATIENTS
PACK | Freq: Once | Status: AC
  Administered 2019-04-08: 22:00:00
  Filled 2019-04-08: qty 1

## 2019-04-08 NOTE — ED Provider Notes (Addendum)
[place]    CSN: [id number] Arrival date & time: 04/08/19  7915      History   Chief Complaint Chief Complaint  Patient presents with  . Leg Pain    Right    HPI Frank Scott is a 68 y.o. male.   Frank Scott presents with complaints of pain to right thigh which started approximately three days ago. Primarily feels it with ambulation, but notices some discomfort at rest as well. States he has had a DVT in the past, was 40 years ago approximately, after he had appendectomy. Doesn't smoke. He is not on blood thinners. No known injury to the leg. No numbness tingling or weakness to the food. No cough, shortness of breath , or chest pain . States multiple family members, including his children have had blood clots, he feel it is familial, although he denies any formal evaluation into this. No recent travel or immobilization, no recent surgeries. History of dm, htn, dvt.     ROS per HPI, negative if not otherwise mentioned.      Past Medical History:  Diagnosis Date  . DVT of deep femoral vein ([place]) 1980s  . Hypertension     Patient Active Problem List   Diagnosis Date Noted  . Hyperlipidemia 11/16/2015  . Type 2 diabetes mellitus ([place]) 11/05/2015  . Erectile dysfunction 10/15/2015  . HTN (hypertension) 10/04/2012  . Traumatic intracerebral hemorrhage ([place]) 09/07/2012  . OBESITY 11/02/2009    Past Surgical History:  Procedure Laterality Date  . APPENDECTOMY  1980s  . CRANIOTOMY Right 09/07/2012   Procedure: Elevation of Decompressed Skull Charolette Forward;  Surgeon: Faythe Ghee, MD;  Location: [facility];  Service: Neurosurgery;  Laterality: Right;  Elevation of Right Fronto-Temporal Skull Fracture       Home Medications    Prior to Admission medications   Medication Sig Start Date End Date Taking? Authorizing Provider  ASPIRIN LOW DOSE 81 MG EC tablet TAKE 1 TABLET(81 MG) BY MOUTH DAILY 03/05/19   Lockamy, Timothy, DO  atorvastatin (LIPITOR) 80  MG tablet Take 1 tablet (80 mg total) by mouth daily. 11/01/17   Nicolette Bang, DO  atorvastatin (LIPITOR) 80 MG tablet Take 1 tablet (80 mg total) by mouth daily. 12/13/18   Nuala Alpha, DO  benzonatate (TESSALON) 100 MG capsule Take 1 capsule (100 mg total) by mouth every 8 (eight) hours. 06/30/17   Tasia Catchings, Amy V, PA-C  Blood Glucose Monitoring Suppl (ONE TOUCH ULTRA 2) w/Device KIT Check blood sugar fasting in the morning and one other time two hours after a meal. 11/04/15   Nicolette Bang, DO  cetirizine-pseudoephedrine (ZYRTEC-D) 5-120 MG tablet Take 1 tablet by mouth daily. 06/30/17   Tasia Catchings, Amy V, PA-C  chlorpheniramine-HYDROcodone (TUSSIONEX PENNKINETIC ER) 10-8 MG/5ML SUER Take 5 mLs by mouth at bedtime as needed for cough. 06/30/17   Ok Edwards, PA-C  Docusate Sodium (DSS) 100 MG CAPS Take 100 mg by mouth 2 (two) times daily as needed (for constipation). 09/14/12   White, Elizabeth A, PA-C  fluticasone (FLONASE) 50 MCG/ACT nasal spray Place 2 sprays into both nostrils daily. 06/30/17   Tasia Catchings, Amy V, PA-C  glucose blood ([employer] GLUCOSE METER TEST STRIPS) test strip Use as instructed 04/03/19   Nuala Alpha, DO  lisinopril (PRINIVIL,ZESTRIL) 5 MG tablet Take 1 tablet (5 mg total) by mouth daily. 03/09/18   Nuala Alpha, DO  metFORMIN (GLUCOPHAGE) 1000 MG tablet TAKE 1 TABLET BY MOUTH TWICE DAILY WITH  A MEAL 02/06/17   Nicolette Bang, DO  ONE TOUCH ULTRA TEST test strip CHECK BLOOD SUGAR FASTING IN THE MORNING AND ONE OTHER TIME 2 HOURS AFTER A MEAL 12/23/16   Nicolette Bang, DO  ONETOUCH DELICA LANCETS 12I MISC CHECK BLOOD SUGAR FASTING IN THE MORNING AND ONE OTHER TIME 2 HOURS AFTER A MEAL 04/14/17   Nicolette Bang, DO  polyethylene glycol [facility] / GLYCOLAX) packet Take 17 g by mouth daily as needed (for constipation). 09/14/12   White, Elizabeth A, PA-C  sildenafil (VIAGRA) 100 MG tablet TAKE 1/2 TO 1 TABLET BY MOUTH DAILY AS NEEDED FOR ERECTILE  DYSFUNCTION 10/25/18   Nuala Alpha, DO  traMADol (ULTRAM) 50 MG tablet Take 50-100 mg by mouth every 6 (six) hours as needed for pain. 09/14/12   Iona Hansen, PA-C    Family History Family History  Problem Relation Age of Onset  . Deep vein thrombosis Daughter   . Clotting disorder Daughter   . Deep vein thrombosis Son   . Diabetes Mother   . Deep vein thrombosis Sister   . Deep vein thrombosis Brother     Social History Social History   Tobacco Use  . Smoking status: Never Smoker  . Smokeless tobacco: Never Used  Substance Use Topics  . Alcohol use: Yes    Alcohol/week: 0.0 standard drinks    Comment: rarely  . Drug use: No     Allergies   Patient has no known allergies.   Review of Systems Review of Systems   Physical Exam Triage Vital Signs ED Triage Vitals  Enc Vitals Group     BP 04/08/19 1015 128/78     Pulse Rate 04/08/19 1015 85     Resp 04/08/19 1015 17     Temp 04/08/19 1015 98.9 F (37.2 C)     Temp Source 04/08/19 1015 Oral     SpO2 04/08/19 1015 99 %     Weight --      Height --      Head Circumference --      Peak Flow --      Pain Score 04/08/19 1017 6     Pain Loc --      Pain Edu? --      Excl. in [place]? --    No data found.  Updated Vital Signs BP 128/78 (BP Location: Right Arm)   Pulse 85   Temp 98.9 F (37.2 C) (Oral)   Resp 17   SpO2 99%    Physical Exam Constitutional:      Appearance: He is well-developed.  Cardiovascular:     Rate and Rhythm: Normal rate.  Pulmonary:     Effort: Pulmonary effort is normal.  Musculoskeletal:     Right upper leg: He exhibits tenderness. He exhibits no bony tenderness, no swelling, no edema, no deformity and no laceration.     Comments: Right medial thigh with tenderness on palpation with prominent vein palpable; no obvious redness swelling or warmth; foot sensation WNL and strong bilateral pedal pulses; ambulatory without difficulty   Skin:    General: Skin is warm and dry.   Neurological:     Mental Status: He is alert and oriented to person, place, and time.      UC Treatments / Results  Labs (all labs ordered are listed, but only abnormal results are displayed) Labs Reviewed - No data to display  EKG   Radiology No results found.  Procedures Procedures (including  critical care time)  Medications Ordered in UC Medications - No data to display  Initial Impression / Assessment and Plan / UC Course  I have reviewed the triage vital signs and the nursing notes.  Pertinent labs & imaging results that were available during my care of the patient were reviewed by me and considered in my medical decision making (see chart for details).     DVT study ordered and to be completed later today. Return precautions provided, discussed negative findings plan with patient. If positive will need blood thinner, may need ER eval.  Patient verbalized understanding and agreeable to plan.  Ambulatory out of clinic without difficulty.      1644: reached patient by phone with recommendations to go the ER due to the extensive DVT found on study today. Patient verbalized understanding and agreeable to plan.   Final Clinical Impressions(s) / UC Diagnoses   Final diagnoses:  Right leg pain     Discharge Instructions     We will let you know at the time of the study the results.  If negative, ibuprofen or tylenol, heat application, follow up with your PCP for follow up as needed.      ED Prescriptions    None     PDMP not reviewed this encounter.   Zigmund Gottron, NP 04/08/19 1255    Zigmund Gottron, NP 04/08/19 1645

## 2019-04-08 NOTE — Discharge Instructions (Signed)
We will let you know at the time of the study the results.  If negative, ibuprofen or tylenol, heat application, follow up with your PCP for follow up as needed.

## 2019-04-08 NOTE — ED Triage Notes (Signed)
Pt presents with intermittent right leg pain for past few days with no relief; Pt has Hx of blood clots

## 2019-04-08 NOTE — Discharge Instructions (Signed)
Take Eliquis as prescribed. Do not take other medications that may increase bleeding such as Advil, aspirin, ibuprofen, naproxen.  You may take Tylenol as needed for intermittent pain. If you fall and hit your head or have another injury, you may need to be evaluated due to your increased risk for bleeding. Follow-up with your primary care doctor for further management of your blood thinner and investigation as to why the clot developed. Follow-up with the vascular surgeon if you develop worsened leg pain, increased swelling, color change of your foot, or any worsening symptoms. Return to the emergency room if you develop chest pain, shortness of breath, numbness, color change of your foot, or any new, worsening, or concerning symptoms.

## 2019-04-08 NOTE — ED Notes (Signed)
Scheduled appt for DVT study at 3pm, [place], pt notified

## 2019-04-08 NOTE — Progress Notes (Signed)
ANTICOAGULATION CONSULT NOTE - Initial Consult  Pharmacy Consult for Eliquis Indication: DVT   Patient Measurements:   Vital Signs: Temp: 98.3 F (36.8 C) (10/12 2036) Temp Source: Oral (10/12 2036) BP: 137/87 (10/12 2132) Pulse Rate: 62 (10/12 2132)  Labs: No results for input(s): HGB, HCT, PLT, APTT, LABPROT, INR, HEPARINUNFRC, HEPRLOWMOCWT, CREATININE, CKTOTAL, CKMB, TROPONINIHS in the last 72 hours.  CrCl cannot be calculated (Patient's most recent lab result is older than the maximum 21 days allowed.).   Medical History: Past Medical History:  Diagnosis Date  . DVT of deep femoral vein ([place]) 1980s  . Hypertension      Assessment: Acute DVT. No anticoag prior to admission. Planning to start Eliquis.  Education completed with patient. Coupon cards given. No labs as of yet. Body weight > 80 kg.  Goal of Therapy:  Monitor platelets by anticoagulation protocol: Yes    Plan:  -Eliquis 10 mg po bid x 7 days followed by 5 mg po bid -Monitor s/sx bleeding   Harvel Quale 04/08/2019,9:38 PM

## 2019-04-08 NOTE — Progress Notes (Signed)
Bilateral lower extremity venous duplex completed. Refer to "CV Proc" under chart review to view preliminary results.  Preliminary results discussed with Frank Scott.  04/08/2019 3:37 PM Maudry Mayhew, MHA, RVT, RDCS, RDMS

## 2019-04-08 NOTE — ED Triage Notes (Signed)
Pt c/o right leg pain, hx blood clots, had an outpatient study done today that was positive. Denies taking blood thinners at this time.

## 2019-04-08 NOTE — ED Provider Notes (Signed)
[facility] EMERGENCY DEPARTMENT Provider Note   CSN: [id number] Arrival date & time: 04/08/19  2032     History   Chief Complaint Chief Complaint  Patient presents with  . DVT    HPI Frank Scott is a 68 y.o. male presenting for evaluation of right leg pain and swelling.  Patient states for the past 4 days, he has been having right leg pain.  Is mostly in his upper thigh on the medial aspect.  He was seen at urgent care, had an ultrasound performed today which showed DVT.  He was told to come to the ER.  He denies any chest pain or shortness of breath.  He denies abdominal pain.  He reports a history of DVT 40 years ago after an appendectomy, he was on anticoagulation for short period of time before was discontinued.  He has had no clots since.  He denies recent travel, surgeries, immobilization, history of cancer, or hormone use.  Patient states he has had multiple family members with DVTs, thought to be a genetic component however no risk factors have been identified for him.  He does follow-up with family practice regularly. He denies recent brain or spine surgery. denies h/o GI bleed or subdural.      HPI  Past Medical History:  Diagnosis Date  . DVT of deep femoral vein ([place]) 1980s  . Hypertension     Patient Active Problem List   Diagnosis Date Noted  . Hyperlipidemia 11/16/2015  . Type 2 diabetes mellitus ([place]) 11/05/2015  . Erectile dysfunction 10/15/2015  . HTN (hypertension) 10/04/2012  . Traumatic intracerebral hemorrhage ([place]) 09/07/2012  . OBESITY 11/02/2009    Past Surgical History:  Procedure Laterality Date  . APPENDECTOMY  1980s  . CRANIOTOMY Right 09/07/2012   Procedure: Elevation of Decompressed Skull [place] Forward;  Surgeon: Faythe Ghee, MD;  Location: [place] [facility];  Service: Neurosurgery;  Laterality: Right;  Elevation of Right Fronto-Temporal Skull Fracture        Home Medications    Prior to Admission medications    Medication Sig Start Date End Date Taking? Authorizing Provider  apixaban (ELIQUIS) 5 MG TABS tablet Take 10 mg (2 tablets) 2 times a day for the next week. Take 5 mg (1 tablet) 2 times a day for the remaining month. 04/08/19   Cortlin Marano, PA-C  ASPIRIN LOW DOSE 81 MG EC tablet TAKE 1 TABLET(81 MG) BY MOUTH DAILY 03/05/19   Lockamy, Timothy, DO  atorvastatin (LIPITOR) 80 MG tablet Take 1 tablet (80 mg total) by mouth daily. 11/01/17   Nicolette Bang, DO  atorvastatin (LIPITOR) 80 MG tablet Take 1 tablet (80 mg total) by mouth daily. 12/13/18   Nuala Alpha, DO  benzonatate (TESSALON) 100 MG capsule Take 1 capsule (100 mg total) by mouth every 8 (eight) hours. 06/30/17   Tasia Catchings, Amy V, PA-C  Blood Glucose Monitoring Suppl (ONE TOUCH ULTRA 2) w/Device KIT Check blood sugar fasting in the morning and one other time two hours after a meal. 11/04/15   Nicolette Bang, DO  cetirizine-pseudoephedrine (ZYRTEC-D) 5-120 MG tablet Take 1 tablet by mouth daily. 06/30/17   Tasia Catchings, Amy V, PA-C  chlorpheniramine-HYDROcodone (TUSSIONEX PENNKINETIC ER) 10-8 MG/5ML SUER Take 5 mLs by mouth at bedtime as needed for cough. 06/30/17   Ok Edwards, PA-C  Docusate Sodium (DSS) 100 MG CAPS Take 100 mg by mouth 2 (two) times daily as needed (for constipation). 09/14/12   Iona Hansen, PA-C  fluticasone (FLONASE) 50 MCG/ACT nasal spray Place 2 sprays into both nostrils daily. 06/30/17   Tasia Catchings, Amy V, PA-C  glucose blood ([employer] GLUCOSE METER TEST STRIPS) test strip Use as instructed 04/03/19   Nuala Alpha, DO  lisinopril (PRINIVIL,ZESTRIL) 5 MG tablet Take 1 tablet (5 mg total) by mouth daily. 03/09/18   Nuala Alpha, DO  metFORMIN (GLUCOPHAGE) 1000 MG tablet TAKE 1 TABLET BY MOUTH TWICE DAILY WITH A MEAL 02/06/17   Nicolette Bang, DO  ONE TOUCH ULTRA TEST test strip CHECK BLOOD SUGAR FASTING IN THE MORNING AND ONE OTHER TIME 2 HOURS AFTER A MEAL 12/23/16   Nicolette Bang, DO  ONETOUCH DELICA  LANCETS 81R MISC CHECK BLOOD SUGAR FASTING IN THE MORNING AND ONE OTHER TIME 2 HOURS AFTER A MEAL 04/14/17   Nicolette Bang, DO  polyethylene glycol (MIRALAX / GLYCOLAX) packet Take 17 g by mouth daily as needed (for constipation). 09/14/12   White, Elizabeth A, PA-C  sildenafil (VIAGRA) 100 MG tablet TAKE 1/2 TO 1 TABLET BY MOUTH DAILY AS NEEDED FOR ERECTILE DYSFUNCTION 10/25/18   Nuala Alpha, DO  traMADol (ULTRAM) 50 MG tablet Take 50-100 mg by mouth every 6 (six) hours as needed for pain. 09/14/12   Iona Hansen, PA-C    Family History Family History  Problem Relation Age of Onset  . Deep vein thrombosis Daughter   . Clotting disorder Daughter   . Deep vein thrombosis Son   . Diabetes Mother   . Deep vein thrombosis Sister   . Deep vein thrombosis Brother     Social History Social History   Tobacco Use  . Smoking status: Never Smoker  . Smokeless tobacco: Never Used  Substance Use Topics  . Alcohol use: Yes    Alcohol/week: 0.0 standard drinks    Comment: rarely  . Drug use: No     Allergies   Patient has no known allergies.   Review of Systems Review of Systems  Respiratory: Negative for shortness of breath.   Cardiovascular: Positive for leg swelling. Negative for chest pain.  All other systems reviewed and are negative.    Physical Exam Updated Vital Signs BP 137/87 (BP Location: Right Arm)   Pulse 62   Temp 98.3 F (36.8 C) (Oral)   Resp 18   SpO2 99%   Physical Exam Vitals signs and nursing note reviewed.  Constitutional:      General: He is not in acute distress.    Appearance: He is well-developed.     Comments: Resting comfortably in the bed in no acute distress.  HENT:     Head: Normocephalic and atraumatic.  Eyes:     Conjunctiva/sclera: Conjunctivae normal.     Pupils: Pupils are equal, round, and reactive to light.  Neck:     Musculoskeletal: Normal range of motion and neck supple.  Cardiovascular:     Rate and  Rhythm: Normal rate and regular rhythm.     Pulses: Normal pulses.  Pulmonary:     Effort: Pulmonary effort is normal. No respiratory distress.     Breath sounds: Normal breath sounds. No wheezing.     Comments: Speaking in full sentences.  Clear lung sounds in all fields.  No dyspnea on exertion Abdominal:     General: There is no distension.     Palpations: Abdomen is soft. There is no mass.     Tenderness: There is no abdominal tenderness. There is no guarding or rebound.  Musculoskeletal: Normal range  of motion.     Right lower leg: Edema present.     Left lower leg: Edema present.     Comments: Bilateral leg swelling, right worse than left.  Mild erythema along the medial aspect of the right upper leg.  Tenderness from the knee to the inguinal region on the medial right leg.  No induration or warmth.  Pedal pulses intact bilaterally.  Good distal cap refill.  Lower extremities warm with intact sensation.  Skin:    General: Skin is warm and dry.     Capillary Refill: Capillary refill takes less than 2 seconds.  Neurological:     Mental Status: He is alert and oriented to person, place, and time.      ED Treatments / Results  Labs (all labs ordered are listed, but only abnormal results are displayed) Labs Reviewed  CBC  BASIC METABOLIC PANEL    EKG None  Radiology Vas [place] Lower Extremity Venous (dvt)  Result Date: 04/08/2019  Lower Venous Study Indications: Pain, Swelling, and remote history of left lower extremity DVT x43 years.  Comparison Study: No prior study. Performing Technologist: Maudry Mayhew MHA, RDMS, RVT, RDCS  Examination Guidelines: A complete evaluation includes B-mode imaging, spectral Doppler, color Doppler, and power Doppler as needed of all accessible portions of each vessel. Bilateral testing is considered an integral part of a complete examination. Limited examinations for reoccurring indications may be performed as noted.   +---------+---------------+---------+-----------+----------+--------------+ RIGHT    CompressibilityPhasicitySpontaneityPropertiesThrombus Aging +---------+---------------+---------+-----------+----------+--------------+ CFV      None                    No                   Acute          +---------+---------------+---------+-----------+----------+--------------+ SFJ      None                                         Acute          +---------+---------------+---------+-----------+----------+--------------+ FV Prox  None                                         Acute          +---------+---------------+---------+-----------+----------+--------------+ FV Mid   None                                         Acute          +---------+---------------+---------+-----------+----------+--------------+ FV DistalNone                                         Acute          +---------+---------------+---------+-----------+----------+--------------+ PFV      None                                         Acute          +---------+---------------+---------+-----------+----------+--------------+ POP      None  No                   Acute          +---------+---------------+---------+-----------+----------+--------------+ PTV      None                                         Acute          +---------+---------------+---------+-----------+----------+--------------+ PERO     None                                         Acute          +---------+---------------+---------+-----------+----------+--------------+ CIV                              No                   Acute          +---------+---------------+---------+-----------+----------+--------------+ EIV                              No                   Acute          +---------+---------------+---------+-----------+----------+--------------+   +---------+---------------+---------+-----------+----------+-----------------+ LEFT     CompressibilityPhasicitySpontaneityPropertiesThrombus Aging    +---------+---------------+---------+-----------+----------+-----------------+ CFV      Partial        Yes      Yes                  Age Indeterminate +---------+---------------+---------+-----------+----------+-----------------+ SFJ      Partial                                      Age Indeterminate +---------+---------------+---------+-----------+----------+-----------------+ FV Prox  Partial                                      Age Indeterminate +---------+---------------+---------+-----------+----------+-----------------+ FV Mid   Partial                                      Age Indeterminate +---------+---------------+---------+-----------+----------+-----------------+ FV DistalPartial                                      Age Indeterminate +---------+---------------+---------+-----------+----------+-----------------+ POP      Partial        Yes      Yes                                    +---------+---------------+---------+-----------+----------+-----------------+ PTV  Not visualized    +---------+---------------+---------+-----------+----------+-----------------+ PERO                                                  Not visualized    +---------+---------------+---------+-----------+----------+-----------------+  Summary: Right: Findings consistent with acute deep vein thrombosis involving the right common iliac vein, right external iliac vein, right common femoral vein, right femoral vein, right proximal profunda vein, right popliteal vein, right posterior tibial veins, and right peroneal veins. No cystic structure found in the popliteal fossa. Left: Findings consistent with age indeterminate deep vein thrombosis involving the left common femoral  vein, left femoral vein, and left popliteal vein. No cystic structure found in the popliteal fossa.  Unable to visualize IVC due to overlying bowel gas. *See table(s) above for measurements and observations.    Preliminary     Procedures Procedures (including critical care time)  Medications Ordered in ED Medications  apixaban (ELIQUIS) tablet 10 mg (10 mg Oral Given 04/08/19 2200)  apixaban Arne [place]) Education Kit for DVT/PE patients ( Does not apply Given 04/08/19 2201)     Initial Impression / Assessment and Plan / ED Course  I have reviewed the triage vital signs and the nursing notes.  Pertinent labs & imaging results that were available during my care of the patient were reviewed by me and considered in my medical decision making (see chart for details).  Clinical Course as of Apr 07 2302  Mon Apr 08, 4467  [birth year]13 68 year old male prior history of DVT here with increased leg swelling for few days.  He has had an outpatient study showing he has a DVT acute in the right along with some chronic DVT in the left.  Interestingly he said he has a lot of family members that are also positive and they think it something genetic although he is not exactly sure.  No chest pain or shortness of breath pulse ox 99% on room air.  Likely outpatient management of this although will review with vascular.   [MB]    Clinical Course User Index [MB] Hayden Rasmussen, MD       Patient presenting for evaluation of right leg pain in the setting of a positive ultrasound for DVT.  On exam, patient appears nontoxic.  No chest pain or shortness of breath indicate PE.  Ultrasound results reviewed.  Patient with acute extensive proximal DVT of the right leg, and age indeterminate DVT of the left leg.  Considering patient's age, extensive clot burden, and bilateral findings, will consult with vascular.  Discussed with Dr. Carlis Abbott from vascular surgery who recommends outpatient treatment with anticoagulation.   States that as patient is mildly symptomatic, the risks of any thrombectomy would outweigh benefits.  Recommends follow-up in the clinic if symptoms are worsening or as needed for further evaluation.  Discussed findings and plan with patient, who is agreeable.  Will obtain basic labs to check kidney function and platelets.  Will start patient on Eliquis.  At this time, patient appears safe for discharge.  Return precautions given.  Patient states he understands and agrees to plan.  Final Clinical Impressions(s) / ED Diagnoses   Final diagnoses:  Acute deep vein thrombosis (DVT) of proximal vein of right lower extremity (HCC)  Deep vein thrombosis (DVT) of proximal vein of left lower extremity, unspecified chronicity [facility])    ED Discharge Orders  Ordered    apixaban (ELIQUIS) 5 MG TABS tablet     04/08/19 2303           Franchot Heidelberg, PA-C 04/08/19 2315    Hayden Rasmussen, MD 04/09/19 1329

## 2019-04-18 ENCOUNTER — Ambulatory Visit (INDEPENDENT_AMBULATORY_CARE_PROVIDER_SITE_OTHER): Payer: Medicare Other | Admitting: Family Medicine

## 2019-04-18 ENCOUNTER — Other Ambulatory Visit: Payer: Self-pay

## 2019-04-18 ENCOUNTER — Encounter: Payer: Self-pay | Admitting: Family Medicine

## 2019-04-18 VITALS — BP 150/82 | HR 68 | Ht 72.0 in | Wt 266.2 lb

## 2019-04-18 DIAGNOSIS — I10 Essential (primary) hypertension: Secondary | ICD-10-CM | POA: Diagnosis not present

## 2019-04-18 DIAGNOSIS — E119 Type 2 diabetes mellitus without complications: Secondary | ICD-10-CM | POA: Diagnosis not present

## 2019-04-18 DIAGNOSIS — I824Y9 Acute embolism and thrombosis of unspecified deep veins of unspecified proximal lower extremity: Secondary | ICD-10-CM

## 2019-04-18 DIAGNOSIS — E785 Hyperlipidemia, unspecified: Secondary | ICD-10-CM | POA: Diagnosis not present

## 2019-04-18 LAB — POCT GLYCOSYLATED HEMOGLOBIN (HGB A1C): HbA1c, POC (controlled diabetic range): 12.5 % — AB (ref 0.0–7.0)

## 2019-04-18 MED ORDER — APIXABAN 5 MG PO TABS: 5.0000 mg | ORAL_TABLET | Freq: Two times a day (BID) | ORAL | 2 refills | Status: DC

## 2019-04-18 NOTE — Progress Notes (Addendum)
     Subjective: Chief Complaint  Patient presents with  . Follow-up    hospital    HPI: Frank Scott is a 68 y.o. presenting to clinic today to discuss the following:  Hospital F/u for DVT Patient is doing well with no more leg pain, improved swelling, and has been taking Eliquis daily as prescribed. He had a DVT in the past and this is his second document instance. He had no recent injury, surgery, long trips, or was immobile for any extended period. No difficulty breathing, no pain on deep inspiration, no chest pain.  HTN Patient take Lisinopril 5mg  daily. BP today is not well controlled at Q000111Q systolic. He has not been taking his BP at home. No headaches or vision changes.  DM Patient is poorly controlled and has not been taking prescribed metformin as he said it was "never refilled" so he just quit taking it.   ROS noted in HPI.    Social History   Tobacco Use  Smoking Status Never Smoker  Smokeless Tobacco Never Used    Objective: BP (!) 150/82   Pulse 68   Ht 6' (1.829 m)   Wt 266 lb 4 oz (120.8 kg)   SpO2 98%   BMI 36.11 kg/m  Vitals and nursing notes reviewed  Physical Exam Gen: Alert and Oriented x 3, NAD CV: RRR, no murmurs, normal S1, S2 split Resp: CTAB, no wheezing, rales, or rhonchi, comfortable work of breathing MSK: no calf pain Ext: no clubbing, cyanosis, or edema Skin: warm, dry, intact, no rashes  Assessment/Plan:  HTN (hypertension) Poorly controlled at office visit today at Q000111Q systolic. Not checking at home.  - Patient instructed to record BP in am at home for 2 weeks and bring to next appointment.  - Will adjust if needed based on home BPs  Type 2 diabetes mellitus (Frank Scott) Poorly controlled with A1c at 12.5% with patient not taking metformin. - Patient to return in one week, restart metformin today - Will most likely have warm handoff to Dr. Valentina Lucks as he would benefit from DM education  Hyperlipidemia Repeat lipid panel shows well  controlled on atorvastatin - Cont Atorvastatin at 80mg  daily  DVT (deep venous thrombosis) (Frank Scott) Improved. Leg swelling improved with no pain, negative Homman's exam today in clinic. - Cont Eliquis 5mg  BID for next 3 months; return to clinic at that time and discuss need for lifelong anticoagulation. I recommended that we do continue Eliquis indefinitely.   PATIENT EDUCATION PROVIDED: See AVS    Diagnosis and plan along with any newly prescribed medication(s) were discussed in detail with this patient today. The patient verbalized understanding and agreed with the plan. Patient advised if symptoms worsen return to clinic or ER.    Orders Placed This Encounter  Procedures  . Lipid panel    Order Specific Question:   Has the patient fasted?    Answer:   No  . HgB A1c    Meds ordered this encounter  Medications  . apixaban (ELIQUIS) 5 MG TABS tablet    Sig: Take 1 tablet (5 mg total) by mouth 2 (two) times daily.    Dispense:  60 tablet    Refill:  [place], DO 04/18/2019, 3:27 PM PGY-3 [place]

## 2019-04-18 NOTE — Patient Instructions (Signed)

## 2019-04-19 LAB — LIPID PANEL
Chol/HDL Ratio: 2.9 ratio (ref 0.0–5.0)
Cholesterol, Total: 133 mg/dL (ref 100–199)
HDL: 46 mg/dL (ref >39)
LDL Chol Calc (NIH): 72 mg/dL (ref 0–99)
Triglycerides: 78 mg/dL (ref 0–149)
VLDL Cholesterol Cal: 15 mg/dL (ref 5–40)

## 2019-04-22 NOTE — Assessment & Plan Note (Signed)
Poorly controlled with A1c at 12.5% with patient not taking metformin. - Patient to return in one week, restart metformin today - Will most likely have warm handoff to Dr. Valentina Lucks as he would benefit from DM education

## 2019-04-22 NOTE — Assessment & Plan Note (Signed)
Repeat lipid panel shows well controlled on atorvastatin - Cont Atorvastatin at 80mg  daily

## 2019-04-22 NOTE — Assessment & Plan Note (Signed)
Poorly controlled at office visit today at Q000111Q systolic. Not checking at home.  - Patient instructed to record BP in am at home for 2 weeks and bring to next appointment.  - Will adjust if needed based on home BPs

## 2019-04-24 DIAGNOSIS — I82409 Acute embolism and thrombosis of unspecified deep veins of unspecified lower extremity: Secondary | ICD-10-CM | POA: Insufficient documentation

## 2019-04-24 DIAGNOSIS — Z86718 Personal history of other venous thrombosis and embolism: Secondary | ICD-10-CM | POA: Insufficient documentation

## 2019-04-24 NOTE — Assessment & Plan Note (Signed)
Improved. Leg swelling improved with no pain, negative Homman's exam today in clinic. - Cont Eliquis 5mg  BID for next 3 months; return to clinic at that time and discuss need for lifelong anticoagulation. I recommended that we do continue Eliquis indefinitely.

## 2019-05-13 ENCOUNTER — Other Ambulatory Visit: Payer: Self-pay

## 2019-05-13 ENCOUNTER — Ambulatory Visit (INDEPENDENT_AMBULATORY_CARE_PROVIDER_SITE_OTHER): Payer: Medicare Other | Admitting: Family Medicine

## 2019-05-13 VITALS — BP 127/75 | HR 74 | Wt 268.4 lb

## 2019-05-13 DIAGNOSIS — E119 Type 2 diabetes mellitus without complications: Secondary | ICD-10-CM

## 2019-05-13 DIAGNOSIS — E785 Hyperlipidemia, unspecified: Secondary | ICD-10-CM

## 2019-05-13 DIAGNOSIS — I824Y9 Acute embolism and thrombosis of unspecified deep veins of unspecified proximal lower extremity: Secondary | ICD-10-CM | POA: Diagnosis not present

## 2019-05-13 DIAGNOSIS — Z794 Long term (current) use of insulin: Secondary | ICD-10-CM

## 2019-05-13 DIAGNOSIS — R5383 Other fatigue: Secondary | ICD-10-CM

## 2019-05-13 DIAGNOSIS — I10 Essential (primary) hypertension: Secondary | ICD-10-CM

## 2019-05-13 MED ORDER — METFORMIN HCL 1000 MG PO TABS: ORAL_TABLET | ORAL | 3 refills | Status: DC

## 2019-05-13 NOTE — Patient Instructions (Signed)
It was great to see you today! Thank you for letting me participate in your care!  Today, we discussed your continued fatigue and low energy. I am checking your thyroid to ensure it is still working properly but I suspect most of your fatigue is due to deconditioning and will get better as you get more active.   Please continue taking Eliquis but if you need a dental procedure you can stop it 4-5 days before the procedure and then restart it as soon as the procedure is complete.   Please pick up Metformin and begin taking it. I will see you in 2 months for follow up to see how your blood sugar levels are doing.  Be well, Harolyn Rutherford, DO PGY-3, [facility]

## 2019-05-13 NOTE — Assessment & Plan Note (Signed)
Stable with no return of symptoms, compliant on Eliquis - Cont Eliquis 5mg  BID for 2 more months and we will discuss life long anticoagulation at follow up. - I again recommended he continue Eliquis or some type of life long anticoagulation with two episodes of unprovoked DVT

## 2019-05-13 NOTE — Assessment & Plan Note (Signed)
Stable, cont Atrovastatin high dose

## 2019-05-13 NOTE — Progress Notes (Signed)
     Subjective: Chief Complaint  Patient presents with  . Diabetes   HPI: Frank Scott is a 68 y.o. presenting to clinic today to discuss the following:  DVT Doing well on Eliquis. Discussed again that I recommend he stay on life long anticoagulation and the risks of having another DVT/PE outweigh risks of a major bleed. Patient seems to understand is willing to continue taking Eliquis after 3 months is up. No more lower extremity pain or swelling since he was in the hospital. Patient is staying active but is more fatigued.  HLD Patient compliant on statin. Requests refill today.  HTN Well controlled at today's visit. Compliant with Lisinopril. Taking BP at home with systolic average below [id number] with range of Q000111Q and systolic [id number]. No chest pain, SOB, or difficulty breathing.  DM Patient states he went to the pharmacy and metformin was not there or available for him to pick up so he has not been taking anything for his diabetes. He still "sometimes eat things I know I shouldn't". He is checking his blood sugar some mornings and states it is usually around "180s". No near syncope or syncope, no polyuria, polydipsia, no chagnes in vision, chest pain, or abdominal pain.  ROS noted in HPI.    Social History   Tobacco Use  Smoking Status Never Smoker  Smokeless Tobacco Never Used    Objective: BP 127/75   Pulse 74   Wt 268 lb 6.4 oz (121.7 kg)   SpO2 98%   BMI 36.40 kg/m  Vitals and nursing notes reviewed  Physical Exam Gen: Alert and Oriented x 3, NAD CV: RRR, no murmurs, normal S1, S2 split Resp: CTAB, no wheezing, rales, or rhonchi, comfortable work of breathing Ext: no clubbing, cyanosis, or edema, no calf tenderness Skin: warm, dry, intact, no rashes  Assessment/Plan:  Hyperlipidemia Stable, cont Atrovastatin high dose  Type 2 diabetes mellitus (HCC) Poorly controlled, instructed patient to pick up Metformin and start taking and to call if he can't get  the medication. - Cont checking morning fasting CBGs - Start Metfomin 1000mg  BID - Will most likely need to start a GLP-1 or SGLT-2 inhibitor at next visit as he is unlikely to be at goal with metformin alone given his last A1c - Recheck A1c at next visit in 2 months f/u  HTN (hypertension) At goal today and seems to be at goal at home. Well controlled - Cont Lisinopril 5mg  daily  DVT (deep venous thrombosis) (HCC) Stable with no return of symptoms, compliant on Eliquis - Cont Eliquis 5mg  BID for 2 more months and we will discuss life long anticoagulation at follow up. - I again recommended he continue Eliquis or some type of life long anticoagulation with two episodes of unprovoked DVT   PATIENT EDUCATION PROVIDED: See AVS    Diagnosis and plan along with any newly prescribed medication(s) were discussed in detail with this patient today. The patient verbalized understanding and agreed with the plan. Patient advised if symptoms worsen return to clinic or ER.    Orders Placed This Encounter  Procedures  . TSH + free T4    Meds ordered this encounter  Medications  . metFORMIN (GLUCOPHAGE) 1000 MG tablet    Sig: TAKE 1 TABLET BY MOUTH TWICE DAILY WITH A MEAL    Dispense:  180 tablet    Refill:  [place], DO 05/13/2019, 10:43 AM PGY-3 [place]

## 2019-05-13 NOTE — Assessment & Plan Note (Signed)
Poorly controlled, instructed patient to pick up Metformin and start taking and to call if he can't get the medication. - Cont checking morning fasting CBGs - Start Metfomin 1000mg  BID - Will most likely need to start a GLP-1 or SGLT-2 inhibitor at next visit as he is unlikely to be at goal with metformin alone given his last A1c - Recheck A1c at next visit in 2 months f/u

## 2019-05-13 NOTE — Assessment & Plan Note (Signed)
At goal today and seems to be at goal at home. Well controlled - Cont Lisinopril 5mg  daily

## 2019-05-14 LAB — TSH+FREE T4
Free T4: 1.18 ng/dL (ref 0.82–1.77)
TSH: 1.16 u[IU]/mL (ref 0.450–4.500)

## 2019-05-15 ENCOUNTER — Encounter: Payer: Self-pay | Admitting: Family Medicine

## 2019-05-15 NOTE — Progress Notes (Signed)
Normal thyroid function labs

## 2019-06-03 ENCOUNTER — Other Ambulatory Visit: Payer: Self-pay

## 2019-06-03 DIAGNOSIS — E119 Type 2 diabetes mellitus without complications: Secondary | ICD-10-CM

## 2019-06-03 DIAGNOSIS — I10 Essential (primary) hypertension: Secondary | ICD-10-CM

## 2019-06-03 MED ORDER — LISINOPRIL 5 MG PO TABS: 5.0000 mg | ORAL_TABLET | Freq: Every day | ORAL | 3 refills | Status: DC

## 2019-06-10 ENCOUNTER — Encounter: Payer: Self-pay | Admitting: Family Medicine

## 2019-06-10 ENCOUNTER — Other Ambulatory Visit: Payer: Self-pay

## 2019-06-10 ENCOUNTER — Ambulatory Visit (INDEPENDENT_AMBULATORY_CARE_PROVIDER_SITE_OTHER): Payer: Medicare Other | Admitting: Family Medicine

## 2019-06-10 VITALS — BP 138/82 | HR 70 | Wt 269.0 lb

## 2019-06-10 DIAGNOSIS — E119 Type 2 diabetes mellitus without complications: Secondary | ICD-10-CM | POA: Diagnosis not present

## 2019-06-10 DIAGNOSIS — Z23 Encounter for immunization: Secondary | ICD-10-CM | POA: Diagnosis not present

## 2019-06-10 DIAGNOSIS — I10 Essential (primary) hypertension: Secondary | ICD-10-CM

## 2019-06-10 DIAGNOSIS — Z0000 Encounter for general adult medical examination without abnormal findings: Secondary | ICD-10-CM | POA: Diagnosis not present

## 2019-06-10 NOTE — Patient Instructions (Signed)
It was great to see you today! Thank you for letting me participate in your care!  Today, we discussed your diabetes and I am glad to hear you are watching your diet and are exercising and taking Metformin! Keep it up and we will see results!  Please continue taking your Lisinopril as prescribed as your blood pressure is under good control.  Be well, Frank Rutherford, DO PGY-3, [facility]

## 2019-06-10 NOTE — Assessment & Plan Note (Signed)
Still at goal at checkup today - Con Lisinopril 5mg  daily at night time

## 2019-06-10 NOTE — Progress Notes (Signed)
     Subjective: Chief Complaint  Patient presents with  . Follow-up     HPI: Frank Scott is a 68 y.o. presenting to clinic today to discuss the following:  T2DM Follow up Patient states he has not been checking his blood glucose at home regularly like he knows he should. He has all his supplies but often just forgets and/or gets distracted as he does care for his wife who is recovering from a stroke. He acknowledges he can and will check it more regularly. He last checked it last week and it was in the 180s. He states it is rarely been over 200 since taking Metformin. He is eating better, and is walking 90mi per day 3 times per week.   HTN Follow up Patient states he has/has not been checking his BP at home but not everyday, just several times a week. His average is 130s over 80s. None over 160 or 100. He states he is taking Lisinopril as prescribed with on issues.  Health Maintenance: Hep C screen, PNA vaccine, DM foot exam     ROS noted in HPI.    Social History   Tobacco Use  Smoking Status Never Smoker  Smokeless Tobacco Never Used    Objective: BP 138/82   Pulse 70   Wt 269 lb (122 kg)   SpO2 98%   BMI 36.48 kg/m  Vitals and nursing notes reviewed  Physical Exam Gen: Alert and Oriented x 3, NADD CV: RRR, no murmurs, normal S1, S2 split Resp: CTAB, no wheezing, rales, or rhonchi, comfortable work of breathing Ext: no clubbing, cyanosis, or edema; for foot exam look under quality metrics section Skin: warm, dry, intact, no rashes  Assessment/Plan:  HTN (hypertension) Still at goal at checkup today - Con Lisinopril 5mg  daily at night time  Type 2 diabetes mellitus ([place]) Patient not checking BG regularly so we did discuss importance of doing that and patient expressed agreement and understanding to start checking every morning before breakfast. I do believe his A1c will be much improved with his increase in activity and his dietary changes. We most likely  will still have a little ways to go but headed in the right direction. - Cont Meftormin 1000mg  BID - A1c check in one month, if still above goal start GLP-1 or SGLT-2 in one month   PATIENT EDUCATION PROVIDED: See AVS    Diagnosis and plan along with any newly prescribed medication(s) were discussed in detail with this patient today. The patient verbalized understanding and agreed with the plan. Patient advised if symptoms worsen return to clinic or ER.    Orders Placed This Encounter  Procedures  . Hepatitis C antibody   Harolyn Rutherford, DO 06/10/2019, 1:40 PM PGY-3 [place]

## 2019-06-10 NOTE — Assessment & Plan Note (Signed)
Patient not checking BG regularly so we did discuss importance of doing that and patient expressed agreement and understanding to start checking every morning before breakfast. I do believe his A1c will be much improved with his increase in activity and his dietary changes. We most likely will still have a little ways to go but headed in the right direction. - Cont Meftormin 1000mg  BID - A1c check in one month, if still above goal start GLP-1 or SGLT-2 in one month

## 2019-06-11 LAB — HEPATITIS C ANTIBODY: Hep C Virus Ab: <0.1 s/co ratio (ref 0.0–0.9)

## 2019-07-15 ENCOUNTER — Ambulatory Visit (INDEPENDENT_AMBULATORY_CARE_PROVIDER_SITE_OTHER): Payer: Medicare Other | Admitting: Family Medicine

## 2019-07-15 ENCOUNTER — Other Ambulatory Visit: Payer: Self-pay

## 2019-07-15 VITALS — BP 140/70 | HR 72 | Wt 266.2 lb

## 2019-07-15 DIAGNOSIS — Z0000 Encounter for general adult medical examination without abnormal findings: Secondary | ICD-10-CM

## 2019-07-15 DIAGNOSIS — I10 Essential (primary) hypertension: Secondary | ICD-10-CM

## 2019-07-15 DIAGNOSIS — E119 Type 2 diabetes mellitus without complications: Secondary | ICD-10-CM

## 2019-07-15 LAB — POCT GLYCOSYLATED HEMOGLOBIN (HGB A1C): HbA1c, POC (controlled diabetic range): 7.9 % — AB (ref 0.0–7.0)

## 2019-07-15 NOTE — Assessment & Plan Note (Signed)
Still at goal with good control at home - Cont Lisinopril 5mg  - Repeat BMET in one year

## 2019-07-15 NOTE — Assessment & Plan Note (Signed)
Not to goal but marked improvement with A1c at 7.9% - Discussed options and given he has improved so much with activity and diet modification as well as adherence to metformin I believe it is not unreasonable to give him 3 more months to get to goal. - Recheck A1c in 3 months. If still not to goal I did discuss with patient I would strongly recommend starting him on SGLT-2 or GLP1 agonist to get him to goal and he was agreeable.

## 2019-07-15 NOTE — Patient Instructions (Addendum)
It was great to see you today! Thank you for letting me participate in your care!  Today, we discussed your type 2 diabetes and you have made significant improvement in your A1c. It is still a little bit higher than what we would like but you are moving in the right direction! Please keep doing what you are doing; eat healthy, limit snacks and sugary sweets, and stay active for 30 minutes per day 5 times per week.  Please call the COVID-19 hotline at [phone] to see when you will be eligible.   Be well, Harolyn Rutherford, DO PGY-3, [facility]

## 2019-07-15 NOTE — Progress Notes (Signed)
     Subjective: HPI: Frank Scott is a 69 y.o. presenting to clinic today to discuss the following:  HTN Patient is checking BP at home every morning or most every morning and states systolic numbers range from [id number] and diastolic numbers range from 70-80s. He has no headaches, blurry vision, vision loss, or chest pain or difficulty breathing. He is taking his medications without any side effects.  T2DM Much improved as his A1c is 7.9% down from 12.5% over 3 months ago. Patient has been compliant with taking metformin and states he has no issues with the medication. He has been limiting his intake overall and has been watching the snacks and sweets that he is consuming. He states he feels like he is in control of his diet and is not overwhelmed by the changes he has made. No polyuria, polydipsia, or urinary changes.     ROS noted in HPI.    Social History   Tobacco Use  Smoking Status Never Smoker  Smokeless Tobacco Never Used    Objective: BP 140/70   Pulse 72   Wt 266 lb 3.2 oz (120.7 kg)   SpO2 98%   BMI 36.10 kg/m  Vitals and nursing notes reviewed  Physical Exam Gen: Alert and Oriented x 3, NAD HEENT: Normocephalic, atraumatic CV: RRR, no murmurs, normal S1, S2 split Resp: CTAB, no wheezing, rales, or rhonchi, comfortable work of breathing Ext: no clubbing, cyanosis, or edema Skin: warm, dry, intact, no rashes  Results for orders placed or performed in visit on 07/15/19 (from the past 72 hour(s))  POCT glycosylated hemoglobin (Hb A1C)     Status: Abnormal   Collection Time: 07/15/19 11:36 AM  Result Value Ref Range   Hemoglobin A1C     HbA1c POC (<> result, manual entry)     HbA1c, POC (prediabetic range)     HbA1c, POC (controlled diabetic range) 7.9 (A) 0.0 - 7.0 %    Assessment/Plan:  HTN (hypertension) Still at goal with good control at home - Cont Lisinopril 5mg  - Repeat BMET in one year  Type 2 diabetes mellitus ([place]) Not to goal but marked  improvement with A1c at 7.9% - Discussed options and given he has improved so much with activity and diet modification as well as adherence to metformin I believe it is not unreasonable to give him 3 more months to get to goal. - Recheck A1c in 3 months. If still not to goal I did discuss with patient I would strongly recommend starting him on SGLT-2 or GLP1 agonist to get him to goal and he was agreeable.   PATIENT EDUCATION PROVIDED: See AVS    Diagnosis and plan along with any newly prescribed medication(s) were discussed in detail with this patient today. The patient verbalized understanding and agreed with the plan. Patient advised if symptoms worsen return to clinic or ER.    Orders Placed This Encounter  Procedures  . Cologuard  . POCT glycosylated hemoglobin (Hb A1C)    Associate with Z13.1     Harolyn Rutherford, DO 07/15/2019, 11:24 AM PGY-3 [place]

## 2019-08-10 ENCOUNTER — Ambulatory Visit: Payer: BC Managed Care – PPO | Attending: Internal Medicine

## 2019-08-10 DIAGNOSIS — Z23 Encounter for immunization: Secondary | ICD-10-CM | POA: Insufficient documentation

## 2019-08-10 NOTE — Progress Notes (Signed)
   Covid-19 Vaccination Clinic  Name:  Frank Scott    MRN: [id number] DOB: [birth year]  08/10/2019  [name] was observed post Covid-19 immunization for 15 minutes without incidence. He was provided with Vaccine Information Sheet and instruction to access the V-Safe system.   [name] was instructed to call 911 with any severe reactions post vaccine: Marland Kitchen Difficulty breathing  . Swelling of your face and throat  . A fast heartbeat  . A bad rash all over your body  . Dizziness and weakness    Immunizations Administered    Name Date Dose VIS Date Route   Pfizer COVID-19 Vaccine 08/10/2019  2:43 PM 0.3 mL 06/07/2019 Intramuscular   Manufacturer: [place]   Lot: [id number]   [place]: [id number]

## 2019-08-19 ENCOUNTER — Telehealth: Payer: Self-pay | Admitting: *Deleted

## 2019-08-19 MED ORDER — APIXABAN 5 MG PO TABS: 5.0000 mg | ORAL_TABLET | Freq: Two times a day (BID) | ORAL | 2 refills | Status: DC

## 2019-08-26 MED ORDER — APIXABAN 5 MG PO TABS: 5.0000 mg | ORAL_TABLET | Freq: Two times a day (BID) | ORAL | 2 refills | Status: DC

## 2019-08-26 NOTE — Addendum Note (Signed)
Addended by: Talbot Grumbling on: 08/26/2019 12:27 PM   Modules accepted: Orders

## 2019-08-26 NOTE — Telephone Encounter (Signed)
Patient calls nurse line regarding issues with receiving Eliquis. Original rx was sent in under "print". Patient did not come into office and pick up paper rx. Resent rx electronically.   To PCP

## 2019-09-02 ENCOUNTER — Ambulatory Visit: Payer: BC Managed Care – PPO | Attending: Internal Medicine

## 2019-09-02 DIAGNOSIS — Z23 Encounter for immunization: Secondary | ICD-10-CM | POA: Insufficient documentation

## 2019-09-02 NOTE — Progress Notes (Signed)
   Covid-19 Vaccination Clinic  Name:  Frank Scott    MRN: [id number] DOB: 10-19-1950  09/02/2019  [name] was observed post Covid-19 immunization for 15 minutes without incident. He was provided with Vaccine Information Sheet and instruction to access the V-Safe system.   [name] was instructed to call 911 with any severe reactions post vaccine: Marland Kitchen Difficulty breathing  . Swelling of face and throat  . A fast heartbeat  . A bad rash all over body  . Dizziness and weakness   Immunizations Administered    Name Date Dose VIS Date Route   Pfizer COVID-19 Vaccine 09/02/2019  3:01 PM 0.3 mL 06/07/2019 Intramuscular   Manufacturer: [place]   Lot: [id number]   [place]: [id number]

## 2019-10-29 ENCOUNTER — Encounter: Payer: Self-pay | Admitting: Family Medicine

## 2019-10-29 DIAGNOSIS — H40013 Open angle with borderline findings, low risk, bilateral: Secondary | ICD-10-CM | POA: Diagnosis not present

## 2019-10-29 DIAGNOSIS — H25813 Combined forms of age-related cataract, bilateral: Secondary | ICD-10-CM | POA: Diagnosis not present

## 2019-10-29 DIAGNOSIS — E119 Type 2 diabetes mellitus without complications: Secondary | ICD-10-CM | POA: Diagnosis not present

## 2019-10-29 LAB — HM DIABETES EYE EXAM

## 2019-11-04 ENCOUNTER — Ambulatory Visit: Payer: Medicare Other | Admitting: Family Medicine

## 2019-11-25 ENCOUNTER — Other Ambulatory Visit: Payer: Self-pay | Admitting: Family Medicine

## 2019-11-26 ENCOUNTER — Other Ambulatory Visit: Payer: Self-pay

## 2019-11-26 DIAGNOSIS — E785 Hyperlipidemia, unspecified: Secondary | ICD-10-CM

## 2019-11-26 MED ORDER — ATORVASTATIN CALCIUM 80 MG PO TABS: 80.0000 mg | ORAL_TABLET | Freq: Every day | ORAL | 3 refills | Status: DC

## 2020-02-10 ENCOUNTER — Ambulatory Visit (HOSPITAL_COMMUNITY)
Admission: EM | Admit: 2020-02-10 | Discharge: 2020-02-10 | Disposition: A | Discharge status: Home / Self Care | Payer: Medicare Other | Attending: Family Medicine | Admitting: Family Medicine

## 2020-02-10 ENCOUNTER — Other Ambulatory Visit: Payer: Self-pay

## 2020-02-10 ENCOUNTER — Encounter (HOSPITAL_COMMUNITY): Payer: Self-pay

## 2020-02-10 DIAGNOSIS — E669 Obesity, unspecified: Secondary | ICD-10-CM | POA: Diagnosis not present

## 2020-02-10 DIAGNOSIS — R1010 Upper abdominal pain, unspecified: Secondary | ICD-10-CM

## 2020-02-10 DIAGNOSIS — Z9049 Acquired absence of other specified parts of digestive tract: Secondary | ICD-10-CM | POA: Diagnosis not present

## 2020-02-10 DIAGNOSIS — Z7984 Long term (current) use of oral hypoglycemic drugs: Secondary | ICD-10-CM | POA: Diagnosis not present

## 2020-02-10 DIAGNOSIS — E119 Type 2 diabetes mellitus without complications: Secondary | ICD-10-CM | POA: Diagnosis not present

## 2020-02-10 DIAGNOSIS — E785 Hyperlipidemia, unspecified: Secondary | ICD-10-CM | POA: Insufficient documentation

## 2020-02-10 DIAGNOSIS — Z79899 Other long term (current) drug therapy: Secondary | ICD-10-CM | POA: Insufficient documentation

## 2020-02-10 DIAGNOSIS — Z833 Family history of diabetes mellitus: Secondary | ICD-10-CM | POA: Insufficient documentation

## 2020-02-10 DIAGNOSIS — Z86718 Personal history of other venous thrombosis and embolism: Secondary | ICD-10-CM | POA: Diagnosis not present

## 2020-02-10 DIAGNOSIS — N529 Male erectile dysfunction, unspecified: Secondary | ICD-10-CM | POA: Diagnosis not present

## 2020-02-10 DIAGNOSIS — I10 Essential (primary) hypertension: Secondary | ICD-10-CM | POA: Insufficient documentation

## 2020-02-10 DIAGNOSIS — Z20822 Contact with and (suspected) exposure to covid-19: Secondary | ICD-10-CM | POA: Diagnosis not present

## 2020-02-10 DIAGNOSIS — Z7901 Long term (current) use of anticoagulants: Secondary | ICD-10-CM | POA: Insufficient documentation

## 2020-02-10 LAB — SARS CORONAVIRUS 2 (TAT 6-24 HRS): SARS Coronavirus 2: NEGATIVE

## 2020-02-10 NOTE — ED Provider Notes (Signed)
[facility]    CSN: [id number] Arrival date & time: 02/10/20  0818      History   Chief Complaint Chief Complaint  Patient presents with  . Abdominal Pain    HPI Frank Scott is a 69 y.o. male.   Patient is a 69 year old male with past medical history of high blood pressure and diabetes.  He presents today with generalized upper abdominal pain.  This is been present for the past week and has improved.  Describes the pain as gnawing.  He has had some mild chills.  No associated nausea, vomiting, diarrhea, constipation or fevers.  Has not taken anything for his symptoms.  No current abdominal discomfort.  Denies any alcohol use.  Denies any worsening of symptoms with eating.  Denies any burning or history of GERD.  Has had mild loss of appetite.  No weight loss.  ROS per HPI      Past Medical History:  Diagnosis Date  . DVT of deep femoral vein ([place]) 1980s  . Hypertension     Patient Active Problem List   Diagnosis Date Noted  . DVT (deep venous thrombosis) ([place]) 04/24/2019  . Hyperlipidemia 11/16/2015  . Type 2 diabetes mellitus ([place]) 11/05/2015  . Erectile dysfunction 10/15/2015  . HTN (hypertension) 10/04/2012  . Traumatic intracerebral hemorrhage ([place]) 09/07/2012  . OBESITY 11/02/2009    Past Surgical History:  Procedure Laterality Date  . APPENDECTOMY  1980s  . CRANIOTOMY Right 09/07/2012   Procedure: Elevation of Decompressed Skull [place] Forward;  Surgeon: Faythe Ghee, MD;  Location: [facility];  Service: Neurosurgery;  Laterality: Right;  Elevation of Right Fronto-Temporal Skull Fracture       Home Medications    Prior to Admission medications   Medication Sig Start Date End Date Taking? Authorizing Provider  atorvastatin (LIPITOR) 80 MG tablet Take 1 tablet (80 mg total) by mouth daily. 11/26/19   Nuala Alpha, DO  benzonatate (TESSALON) 100 MG capsule Take 1 capsule (100 mg total) by mouth every 8 (eight) hours. 06/30/17   Tasia Catchings, Amy V,  PA-C  Blood Glucose Monitoring Suppl (ONE TOUCH ULTRA 2) w/Device KIT Check blood sugar fasting in the morning and one other time two hours after a meal. 11/04/15   Nicolette Bang, DO  Docusate Sodium (DSS) 100 MG CAPS Take 100 mg by mouth 2 (two) times daily as needed (for constipation). 09/14/12   White, Elizabeth A, PA-C  ELIQUIS 5 MG TABS tablet TAKE 1 TABLET(5 MG) BY MOUTH TWICE DAILY 11/26/19   Nuala Alpha, DO  glucose blood ([employer] GLUCOSE METER TEST STRIPS) test strip Use as instructed 04/03/19   Lockamy, Timothy, DO  lisinopril (ZESTRIL) 5 MG tablet Take 1 tablet (5 mg total) by mouth daily. 06/03/19   Nuala Alpha, DO  metFORMIN (GLUCOPHAGE) 1000 MG tablet TAKE 1 TABLET BY MOUTH TWICE DAILY WITH A MEAL 05/13/19   Lockamy, Timothy, DO  ONE TOUCH ULTRA TEST test strip CHECK BLOOD SUGAR FASTING IN THE MORNING AND ONE OTHER TIME 2 HOURS AFTER A MEAL 12/23/16   Nicolette Bang, DO  ONETOUCH DELICA LANCETS 63S MISC CHECK BLOOD SUGAR FASTING IN THE MORNING AND ONE OTHER TIME 2 HOURS AFTER A MEAL 04/14/17   Nicolette Bang, DO  polyethylene glycol (MIRALAX / GLYCOLAX) packet Take 17 g by mouth daily as needed (for constipation). 09/14/12   White, Elizabeth A, PA-C  sildenafil (VIAGRA) 100 MG tablet TAKE 1/2 TO 1 TABLET BY MOUTH DAILY AS NEEDED  FOR ERECTILE DYSFUNCTION 10/25/18   Nuala Alpha, DO  traMADol (ULTRAM) 50 MG tablet Take 50-100 mg by mouth every 6 (six) hours as needed for pain. 09/14/12   Iona Hansen, PA-C    Family History Family History  Problem Relation Age of Onset  . Deep vein thrombosis Daughter   . Clotting disorder Daughter   . Deep vein thrombosis Son   . Diabetes Mother   . Deep vein thrombosis Sister   . Deep vein thrombosis Brother     Social History Social History   Tobacco Use  . Smoking status: Never Smoker  . Smokeless tobacco: Never Used  Substance Use Topics  . Alcohol use: Yes    Alcohol/week: 0.0 standard drinks      Comment: rarely  . Drug use: No     Allergies   Patient has no known allergies.   Review of Systems Review of Systems   Physical Exam Triage Vital Signs ED Triage Vitals [02/10/20 0838]  Enc Vitals Group     BP (!) 142/85     Pulse Rate 75     Resp 20     Temp 97.6 F (36.4 C)     Temp Source Oral     SpO2 96 %     Weight      Height      Head Circumference      Peak Flow      Pain Score 9     Pain Loc      Pain Edu?      Excl. in [place]?    No data found.  Updated Vital Signs BP (!) 142/85 (BP Location: Left Arm)   Pulse 75   Temp 97.6 F (36.4 C) (Oral)   Resp 20   SpO2 96%   Visual Acuity Right Eye Distance:   Left Eye Distance:   Bilateral Distance:    Right Eye Near:   Left Eye Near:    Bilateral Near:     Physical Exam Vitals and nursing note reviewed.  Constitutional:      Appearance: Normal appearance.  HENT:     Head: Normocephalic and atraumatic.     Nose: Nose normal.  Eyes:     Conjunctiva/sclera: Conjunctivae normal.  Pulmonary:     Effort: Pulmonary effort is normal.  Abdominal:     General: Abdomen is flat. Bowel sounds are normal. There is no distension or abdominal bruit.     Palpations: Abdomen is soft. There is no hepatomegaly, splenomegaly, mass or pulsatile mass.     Tenderness: There is no abdominal tenderness. There is no right CVA tenderness, left CVA tenderness, guarding or rebound. Negative signs include Murphy's sign.     Hernia: No hernia is present.  Musculoskeletal:        General: Normal range of motion.     Cervical back: Normal range of motion.  Skin:    General: Skin is warm and dry.  Neurological:     Mental Status: He is alert.  Psychiatric:        Mood and Affect: Mood normal.      UC Treatments / Results  Labs (all labs ordered are listed, but only abnormal results are displayed) Labs Reviewed  SARS CORONAVIRUS 2 (TAT 6-24 HRS)    EKG   Radiology No results  found.  Procedures Procedures (including critical care time)  Medications Ordered in UC Medications - No data to display  Initial Impression / Assessment and Plan /  UC Course  I have reviewed the triage vital signs and the nursing notes.  Pertinent labs & imaging results that were available during my care of the patient were reviewed by me and considered in my medical decision making (see chart for details).     Pain in upper abdomen Patient with completely normal exam No abdominal pain and vital signs are normal.  He is not currently having any symptoms Unsure if this is some sort of gallbladder issue that is waxing and waning. Also could have been some sort of virus or something he ate it was found that has resolved Recommend follow-up with his primary care for routine blood work and possible imaging especially if the pain returns. Covid testing done but  not likely. Follow up as needed for continued or worsening symptoms  Final Clinical Impressions(s) / UC Diagnoses   Final diagnoses:  Pain of upper abdomen     Discharge Instructions     Recommend following up with your primary care doctor. Maybe some routine blood work and imaging.  We have tested you for covid.      ED Prescriptions    None     PDMP not reviewed this encounter.   Orvan July, NP 02/10/20 1040

## 2020-02-10 NOTE — ED Triage Notes (Signed)
Pt presents with abdominal pain and chills x 1 week.Denies diarrhea, nausea, vomiting, fever.   Pt has not tried nay medication for the complaints.

## 2020-02-10 NOTE — Discharge Instructions (Addendum)
Recommend following up with your primary care doctor. Maybe some routine blood work and imaging.  We have tested you for covid.

## 2020-02-10 NOTE — ED Triage Notes (Signed)
Pt presents with abdominal pain xs 1 week.

## 2020-02-23 ENCOUNTER — Other Ambulatory Visit: Payer: Self-pay | Admitting: Family Medicine

## 2020-02-24 NOTE — Telephone Encounter (Signed)
Pt was removed from Dr.Frank outside our office.  I have changed it back. Christen Bame, CMA

## 2020-03-25 ENCOUNTER — Telehealth: Payer: Self-pay

## 2020-03-25 ENCOUNTER — Other Ambulatory Visit: Payer: Self-pay

## 2020-03-25 NOTE — Telephone Encounter (Signed)
Recevied [facility] request  fax for ASPIRIN 81MG  LOW DOSE TAB.   Take 1 tab (81mg ) by mouth daily.  This medication is not on current med list. If you want patient to start on this medication. Please send Rx to pharmacy with duration, dose and instructions.  Salvatore Marvel, CMA

## 2020-03-26 NOTE — Telephone Encounter (Signed)
Called and left message with pt.  If he calls back, I want to know if he has a history of heart attack or stroke. If not, he does not need to be on an aspirin.   Matilde Haymaker, MD

## 2020-03-30 NOTE — Telephone Encounter (Signed)
Patient returns call to nurse line regarding missed call from provider. Patient denies history of heart attack or stroke. However, patient reports that medication is not for him and that he has been giving medication to wife. Will create message in her chart for provider to review if medication is appropriate.   To PCP  Talbot Grumbling, RN

## 2020-03-31 ENCOUNTER — Telehealth: Payer: Self-pay

## 2020-03-31 NOTE — Telephone Encounter (Signed)
I filled this rx for the pts wife earlier today.  I was told the rx was for his wife. Matilde Haymaker, MD

## 2020-04-10 ENCOUNTER — Ambulatory Visit (INDEPENDENT_AMBULATORY_CARE_PROVIDER_SITE_OTHER): Payer: Medicare Other | Admitting: Family Medicine

## 2020-04-10 ENCOUNTER — Other Ambulatory Visit: Payer: Self-pay

## 2020-04-10 ENCOUNTER — Encounter: Payer: Self-pay | Admitting: Family Medicine

## 2020-04-10 VITALS — BP 134/80 | HR 84 | Ht 72.0 in | Wt 270.0 lb

## 2020-04-10 DIAGNOSIS — N400 Enlarged prostate without lower urinary tract symptoms: Secondary | ICD-10-CM | POA: Insufficient documentation

## 2020-04-10 DIAGNOSIS — E785 Hyperlipidemia, unspecified: Secondary | ICD-10-CM | POA: Diagnosis not present

## 2020-04-10 DIAGNOSIS — N401 Enlarged prostate with lower urinary tract symptoms: Secondary | ICD-10-CM | POA: Diagnosis not present

## 2020-04-10 DIAGNOSIS — I10 Essential (primary) hypertension: Secondary | ICD-10-CM | POA: Diagnosis not present

## 2020-04-10 DIAGNOSIS — Z23 Encounter for immunization: Secondary | ICD-10-CM

## 2020-04-10 DIAGNOSIS — R3912 Poor urinary stream: Secondary | ICD-10-CM | POA: Diagnosis not present

## 2020-04-10 DIAGNOSIS — I824Y9 Acute embolism and thrombosis of unspecified deep veins of unspecified proximal lower extremity: Secondary | ICD-10-CM

## 2020-04-10 DIAGNOSIS — E119 Type 2 diabetes mellitus without complications: Secondary | ICD-10-CM

## 2020-04-10 LAB — POCT GLYCOSYLATED HEMOGLOBIN (HGB A1C): HbA1c, POC (controlled diabetic range): 8.3 % — AB (ref 0.0–7.0)

## 2020-04-10 MED ORDER — TAMSULOSIN HCL 0.4 MG PO CAPS: 0.4000 mg | ORAL_CAPSULE | Freq: Every day | ORAL | 3 refills | Status: DC

## 2020-04-10 MED ORDER — METFORMIN HCL ER 500 MG PO TB24: 1000.0000 mg | ORAL_TABLET | Freq: Two times a day (BID) | ORAL | 3 refills | Status: DC

## 2020-04-10 NOTE — Patient Instructions (Signed)
Prostate enlargement: He does seem to have a mildly enlarged prostate on exam.  We will get some blood work today to make sure there is no need for you to see a urologist.  I have also sent in a medication called Flomax which you can take once daily to help with your urinary symptoms.  If this does not make any significant improvement in the next month, he can stop taking the medicine.  History of DVT: You will need to be on lifelong anticoagulation.  Continue taking Eliquis as you have been doing.  Diabetes: Looks like your A1c is a little bit worse than it was last time.  Lets change your medicine to the extended release formulation of Metformin so that it is easy for you to take it every day.  Come back to clinic in 3 months to make sure your A1c is improved.  From there we can probably see you every 6 months.

## 2020-04-10 NOTE — Assessment & Plan Note (Signed)
Significant history of two previous DVTs.  He was reminded today that he would need to be on lifelong anticoagulation. -Continue Eliquis 5 mg twice daily

## 2020-04-10 NOTE — Assessment & Plan Note (Addendum)
Mild symptoms based on AUA BPH symptom survey.  Total score of six.  We will get a PSA.  We discussed starting Flomax today.  He was encouraged to trial Flomax for 1 month to see if it significantly improved his symptoms.  He was informed that if he did not experience significant symptom improvement, it is appropriate to stop the medication. -Follow-up PSA

## 2020-04-10 NOTE — Assessment & Plan Note (Signed)
A1c increased from 7.9-8.3.  Slightly above goal of 8.0.  We will try to transition to the extended release formulation of Metformin to reduce GI symptoms and improve compliance.  Of -Follow-up BMP, lipid panel -Begin Metformin XR 1000 mg twice daily -Continue lisinopril -Continue atorvastatin -Return in 3 months

## 2020-04-10 NOTE — Progress Notes (Signed)
    SUBJECTIVE:   CHIEF COMPLAINT / HPI:   Diabetes Current medication includes: -Metformin 1000 mg twice daily -Atorvastatin 80 mg -Lisinopril 5 mg daily He reports that he often does not take his Metformin if he does not have ready access to a bathroom because his Metformin often causes some GI upset.  History of DVT She previously had to known blood clots and is on Eliquis 5 mg twice daily at present.  No concern for shortness of breath or leg swelling today.  Concern for BPH He reports that he does occasionally have urinary symptoms and is concerned about his prostate.  We completed the AUA BPH symptom survey together and he scored a total score of six with his most significant symptom being weak stream.    PERTINENT  PMH / PSH: Two previous episodes of deep vein thromboses.  Hypertension, diabetes, hyperlipidemia  OBJECTIVE:   BP 134/80   Pulse 84   Ht 6' (1.829 m)   Wt 270 lb (122.5 kg)   SpO2 97%   BMI 36.62 kg/m    General: Alert and cooperative and appears to be in no acute distress HEENT: Neck non-tender without lymphadenopathy, masses or thyromegaly Cardio: Normal S1 and S2, no S3 or S4. Rhythm is regular. No murmurs or rubs.   Pulm: Clear to auscultation bilaterally, no crackles, wheezing, or diminished breath sounds. Normal respiratory effort Abdomen: Bowel sounds normal. Abdomen soft and non-tender.  Extremities: No peripheral edema. Warm/ well perfused.  Strong radial pulse. Rectal: Mild prostate enlargement.  No significant tenderness to palpation.  No nodules appreciated on exam. Neuro: Cranial nerves grossly intact  ASSESSMENT/PLAN:   Benign prostate hyperplasia Mild symptoms based on AUA BPH symptom survey.  Total score of six.  We will get a PSA.  We discussed starting Flomax today.  He was encouraged to trial Flomax for 1 month to see if it significantly improved his symptoms.  He was informed that if he did not experience significant symptom  improvement, it is appropriate to stop the medication. -Follow-up PSA  HTN (hypertension) Reasonably controlled today.  He does have risk factors for ASCVD.  At goal below 140/90 is not unreasonable.  I will leave his blood pressure medicine at his current dose.  He may experience an additional mild lowering of his blood pressure while starting tamsulosin. -Follow-up BMP  DVT (deep venous thrombosis) ([facility]) Significant history of two previous DVTs.  He was reminded today that he would need to be on lifelong anticoagulation. -Continue Eliquis 5 mg twice daily  Type 2 diabetes mellitus ([facility]) A1c increased from 7.9-8.3.  Slightly above goal of 8.0.  We will try to transition to the extended release formulation of Metformin to reduce GI symptoms and improve compliance.  Of -Follow-up BMP, lipid panel -Begin Metformin XR 1000 mg twice daily -Continue lisinopril -Continue atorvastatin -Return in 3 months     Matilde Haymaker, MD [place]

## 2020-04-10 NOTE — Assessment & Plan Note (Signed)
Reasonably controlled today.  He does have risk factors for ASCVD.  At goal below 140/90 is not unreasonable.  I will leave his blood pressure medicine at his current dose.  He may experience an additional mild lowering of his blood pressure while starting tamsulosin. -Follow-up BMP

## 2020-04-11 LAB — BASIC METABOLIC PANEL
BUN/Creatinine Ratio: 13 (ref 10–24)
BUN: 13 mg/dL (ref 8–27)
CO2: 27 mmol/L (ref 20–29)
Calcium: 9.5 mg/dL (ref 8.6–10.2)
Chloride: 99 mmol/L (ref 96–106)
Creatinine, Ser: 0.99 mg/dL (ref 0.76–1.27)
GFR calc Af Amer: 90 mL/min/{1.73_m2} (ref >59)
GFR calc non Af Amer: 78 mL/min/{1.73_m2} (ref >59)
Glucose: 161 mg/dL — ABNORMAL HIGH (ref 65–99)
Potassium: 4.2 mmol/L (ref 3.5–5.2)
Sodium: 138 mmol/L (ref 134–144)

## 2020-04-11 LAB — PSA: Prostate Specific Ag, Serum: 1.2 ng/mL (ref 0.0–4.0)

## 2020-04-11 LAB — LIPID PANEL
Chol/HDL Ratio: 4 ratio (ref 0.0–5.0)
Cholesterol, Total: 174 mg/dL (ref 100–199)
HDL: 44 mg/dL (ref >39)
LDL Chol Calc (NIH): 102 mg/dL — ABNORMAL HIGH (ref 0–99)
Triglycerides: 158 mg/dL — ABNORMAL HIGH (ref 0–149)
VLDL Cholesterol Cal: 28 mg/dL (ref 5–40)

## 2020-05-23 ENCOUNTER — Other Ambulatory Visit: Payer: Self-pay | Admitting: Family Medicine

## 2020-05-23 DIAGNOSIS — I10 Essential (primary) hypertension: Secondary | ICD-10-CM

## 2020-05-23 DIAGNOSIS — E119 Type 2 diabetes mellitus without complications: Secondary | ICD-10-CM

## 2020-06-12 ENCOUNTER — Other Ambulatory Visit: Payer: Self-pay

## 2020-06-12 MED ORDER — SILDENAFIL CITRATE 100 MG PO TABS: ORAL_TABLET | ORAL | 0 refills | Status: DC

## 2020-06-30 ENCOUNTER — Other Ambulatory Visit: Payer: Self-pay

## 2020-06-30 ENCOUNTER — Ambulatory Visit (INDEPENDENT_AMBULATORY_CARE_PROVIDER_SITE_OTHER): Payer: Medicare Other | Admitting: Family Medicine

## 2020-06-30 VITALS — BP 148/78 | HR 67 | Wt 275.0 lb

## 2020-06-30 DIAGNOSIS — I10 Essential (primary) hypertension: Secondary | ICD-10-CM

## 2020-06-30 DIAGNOSIS — Z1211 Encounter for screening for malignant neoplasm of colon: Secondary | ICD-10-CM | POA: Diagnosis not present

## 2020-06-30 DIAGNOSIS — E119 Type 2 diabetes mellitus without complications: Secondary | ICD-10-CM

## 2020-06-30 DIAGNOSIS — N401 Enlarged prostate with lower urinary tract symptoms: Secondary | ICD-10-CM

## 2020-06-30 DIAGNOSIS — R3912 Poor urinary stream: Secondary | ICD-10-CM

## 2020-06-30 LAB — POCT GLYCOSYLATED HEMOGLOBIN (HGB A1C): Hemoglobin A1C: 7 % — AB (ref 4.0–5.6)

## 2020-06-30 MED ORDER — LISINOPRIL 10 MG PO TABS: 20.0000 mg | ORAL_TABLET | Freq: Every day | ORAL | 0 refills | Status: DC

## 2020-06-30 NOTE — Patient Instructions (Signed)
Benign prostate hyperplasia: Usually, people use tamsulosin every day to get the best effect.  I know that your symptoms are relatively mild and I do not think there is any significant harm in using this only occasionally.  Hypertension: We are going to recheck your blood pressure today.  I have sent an increased dose of lisinopril to your pharmacy.  Please come back to clinic in the next week for a blood draw.  Please come back in 1 month and will have another visit to check your blood pressure.  Diabetes: Great job on reducing her A1c to 7.0.  No need to make any medication adjustments right now.  I have placed a referral to GI for your colonoscopy, you should get a call in the next 1-2 weeks to set up your appointment.  Please let me know if you have not received a call in the next 2 weeks.

## 2020-06-30 NOTE — Assessment & Plan Note (Signed)
A1c improved to 7.0 today.  No additional medication needed for A1c control.  He was offered medication and nutrition guidance for weight control, both of which she declined. -Well-controlled, no new medication -Diabetic foot exam at next visit in 1 month

## 2020-06-30 NOTE — Progress Notes (Addendum)
    SUBJECTIVE:   CHIEF COMPLAINT / HPI:   BPH At our last visit, we discussed benign prostatic hyperplasia and [name] was started on tamsulosin.  He reports that he has been taking it inconsistently when he notes worsened symptoms of BPH.  His symptoms are generally mild and he does not like taking pills so he only takes a pill sometimes.  He finds that he does have a stronger urinary stream when he takes the pills and generally urinates more comfortably.  Hypertension He currently takes lisinopril 5 mg daily.  He notes that he does use this consistently, every day.  Diabetes His current diabetes medicine includes: -Metformin XR 1000 mg twice daily -Atorvastatin 80 mg -Lisinopril 5 mg He has not been focusing significantly on his diet or exercise.  He notes that he has had some minor weight gain since his last visit.  He is not interested in any additional medicine to help with weight gain.  Neither is he interested in meeting with nutrition at this time.    Screening for colon cancer [name] notes that he has never been screened for colon cancer would like to be screened for colon cancer.   PERTINENT  PMH / PSH: Hypertension, history of DVT  OBJECTIVE:   BP (!) 148/78   Pulse 67   Wt 275 lb (124.7 kg)   SpO2 98%   BMI 37.30 kg/m    General: Alert and cooperative and appears to be in no acute distress Cardio: Normal S1 and S2, no S3 or S4. Rhythm is regular. No murmurs or rubs.   Pulm: Clear to auscultation bilaterally, no crackles, wheezing, or diminished breath sounds. Normal respiratory effort Abdomen: Bowel sounds normal. Abdomen soft and non-tender.  Extremities: No peripheral edema. Warm/ well perfused.  Strong radial pulses. Neuro: Cranial nerves grossly intact   ASSESSMENT/PLAN:   HTN (hypertension) His initial blood pressure was elevated at 160/80.  Repeat blood pressure later in the visit was improved to 148/78.  Based on this mildly elevated blood  pressure, we increased his lisinopril to 10 mg daily. -Increase lisinopril to 10 mg daily -Return lab in 1 week for BMP -Return to clinic in 1 month  Type 2 diabetes mellitus (HCC) A1c improved to 7.0 today.  No additional medication needed for A1c control.  He was offered medication and nutrition guidance for weight control, both of which she declined. -Well-controlled, no new medication -Diabetic foot exam at next visit in 1 month  Benign prostate hyperplasia He takes his tamsulosin inconsistently.  He was encouraged to take it regularly for the best result.  He was informed that this can lead to lower blood pressures with consistent use.   Screening for colon cancer -Placed ambulatory referral to GI  Frank Mo, MD [facility]

## 2020-06-30 NOTE — Assessment & Plan Note (Signed)
His initial blood pressure was elevated at 160/80.  Repeat blood pressure later in the visit was improved to 148/78.  Based on this mildly elevated blood pressure, we increased his lisinopril to 10 mg daily. -Increase lisinopril to 10 mg daily -Return lab in 1 week for BMP -Return to clinic in 1 month

## 2020-06-30 NOTE — Assessment & Plan Note (Signed)
He takes his tamsulosin inconsistently.  He was encouraged to take it regularly for the best result.  He was informed that this can lead to lower blood pressures with consistent use.

## 2020-07-07 ENCOUNTER — Other Ambulatory Visit: Payer: Medicare Other

## 2020-07-07 ENCOUNTER — Other Ambulatory Visit: Payer: Self-pay

## 2020-07-07 DIAGNOSIS — I10 Essential (primary) hypertension: Secondary | ICD-10-CM

## 2020-07-08 ENCOUNTER — Encounter: Payer: Self-pay | Admitting: Family Medicine

## 2020-07-08 LAB — BASIC METABOLIC PANEL
BUN/Creatinine Ratio: 13 (ref 10–24)
BUN: 13 mg/dL (ref 8–27)
CO2: 27 mmol/L (ref 20–29)
Calcium: 9.2 mg/dL (ref 8.6–10.2)
Chloride: 99 mmol/L (ref 96–106)
Creatinine, Ser: 1.03 mg/dL (ref 0.76–1.27)
GFR calc Af Amer: 86 mL/min/{1.73_m2} (ref >59)
GFR calc non Af Amer: 74 mL/min/{1.73_m2} (ref >59)
Glucose: 157 mg/dL — ABNORMAL HIGH (ref 65–99)
Potassium: 4 mmol/L (ref 3.5–5.2)
Sodium: 139 mmol/L (ref 134–144)

## 2020-07-23 ENCOUNTER — Ambulatory Visit (INDEPENDENT_AMBULATORY_CARE_PROVIDER_SITE_OTHER): Payer: Medicare Other | Admitting: Family Medicine

## 2020-07-23 ENCOUNTER — Other Ambulatory Visit: Payer: Self-pay

## 2020-07-23 DIAGNOSIS — I10 Essential (primary) hypertension: Secondary | ICD-10-CM | POA: Diagnosis not present

## 2020-07-23 DIAGNOSIS — E119 Type 2 diabetes mellitus without complications: Secondary | ICD-10-CM

## 2020-07-23 MED ORDER — HYDROCHLOROTHIAZIDE 12.5 MG PO TABS: 12.5000 mg | ORAL_TABLET | Freq: Every day | ORAL | 3 refills | Status: DC

## 2020-07-23 NOTE — Progress Notes (Signed)
    SUBJECTIVE:   CHIEF COMPLAINT / HPI:   Hypertension [name] reports that he is taking his lisinopril 20 mg daily.  He does not take any other medication.  No headaches or lightheadedness.  Colonoscopy They have not yet heard or been contacted about an appointment for colonoscopy.  Diabetes He is due for a diabetic foot exam today  PERTINENT  PMH / PSH: Diabetes, hypertension, history of DVT  OBJECTIVE:   BP (!) 158/96   Pulse 96   Temp 98 F (36.7 C) (Oral)   Wt 277 lb (125.6 kg)   SpO2 95%   BMI 37.57 kg/m   General: Alert and cooperative and appears to be in no acute distress Respiratory: Breathing comfortably on room air.  No respiratory distress. Extremities: No peripheral edema. Warm/ well perfused.  Strong radial pulses. Neuro: Cranial nerves grossly intact  Diabetic Foot Exam - Simple   Simple Foot Form Diabetic Foot exam was performed with the following findings: Yes 07/23/2020  3:56 PM  Visual Inspection No deformities, no ulcerations, no other skin breakdown bilaterally: Yes Sensation Testing See comments: Yes Pulse Check Posterior Tibialis and Dorsalis pulse intact bilaterally: Yes Comments Mildly diminished sensation with monofilament test.  There were 2-3 locations on the medial aspect of each foot where he had difficulty with sensation.      ASSESSMENT/PLAN:   HTN (hypertension) Elevated today more than usual.  Repeat blood pressure measured 158/96.  Historic blood pressures show that he is often mildly elevated.  We will start an additional blood pressure medication today. -Continue lisinopril 20 mg daily -Start HCTZ 12.5 mg daily -Follow-up in clinic in 1 month (1 month follow-up seems appropriate based on appropriate kidney function on 1/11)  Type 2 diabetes mellitus ([place]) -Foot exam performed today -Follow-up for diabetes checkup in 2 months   Health maintenance -He was informed that he has been called and a message was left about  calling with our GI for colonoscopy.  He is encouraged to call the [facility] to try to set this up and let us know if he had any trouble. -He was interested in a pneumococcal vaccine today but the clinic was out of vaccines.  Matilde Haymaker, MD [place]

## 2020-07-23 NOTE — Patient Instructions (Signed)
It was great to see you today.  Here is a quick review of the things we talked about:  Blood pressure: We are going to start you on another blood pressure medication today called hydrochlorothiazide.  I want you to start taking 12.5 mg daily.  Please come back to clinic in 1 month to make sure your blood pressure is responding appropriately.  We may do additional blood work at that time.  Colonoscopy: It sounds like you should have a voicemail from the [facility].  Please try to call them back to schedule a colonoscopy for you and Mrs. Haley.  Let me know if you have any trouble with this.

## 2020-07-23 NOTE — Assessment & Plan Note (Signed)
Elevated today more than usual.  Repeat blood pressure measured 158/96.  Historic blood pressures show that he is often mildly elevated.  We will start an additional blood pressure medication today. -Continue lisinopril 20 mg daily -Start HCTZ 12.5 mg daily -Follow-up in clinic in 1 month (1 month follow-up seems appropriate based on appropriate kidney function on 1/11)

## 2020-07-23 NOTE — Assessment & Plan Note (Signed)
-  Foot exam performed today -Follow-up for diabetes checkup in 2 months

## 2020-07-29 ENCOUNTER — Other Ambulatory Visit: Payer: Self-pay | Admitting: Family Medicine

## 2020-08-18 ENCOUNTER — Ambulatory Visit (INDEPENDENT_AMBULATORY_CARE_PROVIDER_SITE_OTHER): Payer: Medicare Other | Admitting: Family Medicine

## 2020-08-18 ENCOUNTER — Encounter: Payer: Self-pay | Admitting: Family Medicine

## 2020-08-18 ENCOUNTER — Other Ambulatory Visit: Payer: Self-pay

## 2020-08-18 DIAGNOSIS — Z23 Encounter for immunization: Secondary | ICD-10-CM | POA: Diagnosis not present

## 2020-08-18 DIAGNOSIS — I10 Essential (primary) hypertension: Secondary | ICD-10-CM | POA: Diagnosis not present

## 2020-08-18 MED ORDER — HYDROCHLOROTHIAZIDE 25 MG PO TABS: 25.0000 mg | ORAL_TABLET | Freq: Every day | ORAL | 3 refills | Status: DC

## 2020-08-18 MED ORDER — LISINOPRIL 40 MG PO TABS
40.0000 mg | ORAL_TABLET | Freq: Every day | ORAL | 3 refills | Status: DC
Start: 2020-08-18 — End: 2021-05-11

## 2020-08-18 NOTE — Assessment & Plan Note (Signed)
Frank Scott blood pressure remains significantly elevated despite two medications. Today we will increase both of these medications and follow-up with assessment of his blood pressure in 1 month. -Increase lisinopril to 40 mg daily -Increase HCTZ to 25 mg daily -Follow-up BMP today -Return to clinic in 1 month

## 2020-08-18 NOTE — Patient Instructions (Signed)
It was great to see you today.  Here is a quick review of the things we talked about:   Blood pressure: Your blood pressure continues to be elevated.  I would like to increase both of your blood pressure medications.  Hydrochlorothiazide-I would like you to take 25 mg daily.  You can take 2 pills of your 12.5 mg until you run out.  At your next refill, you will be able to take 1 pill daily because the pill will be 25 mg.  Lisinopril-I would like you to take 40 mg daily.  You can take 4 pills of your 10 mg tablets until you run out.  At your next refill, you will be able to take 1 pill daily because the pill will be 40 mg.  Please come back in 1 month for [place] to check your blood pressure again.   If all of your labs are normal, I will send you a message over my chart or send you a letter.  If there is anything to discuss, I will give you a phone call.

## 2020-08-18 NOTE — Progress Notes (Signed)
    SUBJECTIVE:   CHIEF COMPLAINT / HPI:   Hypertension [name] presents to clinic today for follow-up regarding elevated blood pressure. He currently takes lisinopril 10 mg daily at home in addition to hydrochlorothiazide 12.5 mg at home. He takes his medication as prescribed and does not miss doses.  Desire for vaccination against pneumonia He would like to be vaccinated and Frank Scott today for vaccine is available.  PERTINENT  PMH / PSH: Diabetes, hypertension, BPH, HLD  OBJECTIVE:   BP (!) 153/99   Pulse 77   Ht 6' (1.829 m)   Wt 276 lb (125.2 kg)   SpO2 98%   BMI 37.43 kg/m    General: Alert and cooperative and appears to be in no acute distress Cardio: Normal S1 and S2, no S3 or S4. Rhythm is regular. No murmurs or rubs.   Pulm: Clear to auscultation bilaterally, no crackles, wheezing, or diminished breath sounds. Normal respiratory effort Abdomen: Bowel sounds normal. Abdomen soft and non-tender.  Extremities: Trace edema noted at his ankles with the impression of a sock line. Warm/ well perfused.  Strong radial pulses. Neuro: Cranial nerves grossly intact  ASSESSMENT/PLAN:   HTN (hypertension) [name] blood pressure remains significantly elevated despite two medications. Today we will increase both of these medications and follow-up with assessment of his blood pressure in 1 month. -Increase lisinopril to 40 mg daily -Increase HCTZ to 25 mg daily -Follow-up BMP today -Return to clinic in 1 month   Desire for vaccination against pneumonia -Twenty-three valent vaccine provided today  Frank Haymaker, MD [place]

## 2020-08-19 ENCOUNTER — Encounter: Payer: Self-pay | Admitting: Family Medicine

## 2020-08-19 LAB — BASIC METABOLIC PANEL
BUN/Creatinine Ratio: 10 (ref 10–24)
BUN: 11 mg/dL (ref 8–27)
CO2: 25 mmol/L (ref 20–29)
Calcium: 10.2 mg/dL (ref 8.6–10.2)
Chloride: 99 mmol/L (ref 96–106)
Creatinine, Ser: 1.15 mg/dL (ref 0.76–1.27)
GFR calc Af Amer: 75 mL/min/{1.73_m2} (ref >59)
GFR calc non Af Amer: 65 mL/min/{1.73_m2} (ref >59)
Glucose: 121 mg/dL — ABNORMAL HIGH (ref 65–99)
Potassium: 4.3 mmol/L (ref 3.5–5.2)
Sodium: 140 mmol/L (ref 134–144)

## 2020-09-01 ENCOUNTER — Ambulatory Visit: Payer: BC Managed Care – PPO | Admitting: Gastroenterology

## 2020-09-16 ENCOUNTER — Other Ambulatory Visit: Payer: Self-pay | Admitting: Family Medicine

## 2020-09-22 ENCOUNTER — Ambulatory Visit (INDEPENDENT_AMBULATORY_CARE_PROVIDER_SITE_OTHER): Payer: Medicare Other

## 2020-09-22 ENCOUNTER — Other Ambulatory Visit: Payer: Self-pay

## 2020-09-22 VITALS — BP 146/78 | HR 60 | Ht 72.0 in | Wt 275.0 lb

## 2020-09-22 DIAGNOSIS — Z0000 Encounter for general adult medical examination without abnormal findings: Secondary | ICD-10-CM

## 2020-09-22 NOTE — Progress Notes (Addendum)
Subjective:   Frank Scott is a 70 y.o. male who presents for Medicare Annual preventive examination.  Review of Systems: Defer to PCP.  Cardiac Risk Factors include: advanced age (>91mn, >>66women);diabetes mellitus  Objective:    Vitals: BP (!) 146/78   Pulse 60   Ht 6' (1.829 m)   Wt 275 lb (124.7 kg)   SpO2 95%   BMI 37.30 kg/m   Body mass index is 37.3 kg/m.  Advanced Directives 09/22/2020 04/10/2020 06/10/2019 03/08/2017 03/29/2016 11/30/2015 11/16/2015  Does Patient Have a Medical Advance Directive? Yes Yes No Yes Yes Yes Yes  Type of AProgrammer, systems- Healthcare Power of AFreescale SemiconductorPower of [place] will Living will -  Does patient want to make changes to medical advance directive? No - Patient declined - - No - Patient declined - - -  Copy of [place]? No - copy requested Yes - validated most recent copy scanned in chart (See row information) - Yes No - copy requested No - copy requested No - copy requested  Would patient like information on creating a medical advance directive? No - Patient declined - No - Patient declined - - - -  Pre-existing out of facility DNR order (yellow form or pink MOST form) - - - - - - -   Tobacco Social History   Tobacco Use  Smoking Status Never Smoker  Smokeless Tobacco Never Used     Clinical Intake:  Pre-visit preparation completed: Yes  Pain Score: 0-No pain  How often do you need to have someone help you when you read instructions, pamphlets, or other written materials from your doctor or pharmacy?: 1 - Never What is the last grade level you completed in school?: College  Interpreter Needed?: No  Past Medical History:  Diagnosis Date  . [place]  . Hypertension    Past Surgical History:  Procedure Laterality Date  . APPENDECTOMY  1980s  . CRANIOTOMY Right 09/07/2012   Procedure: Elevation  of Decompressed Skull FCharolette Forward  Surgeon: RFaythe Ghee MD;  [facility];  Service: Neurosurgery;  Laterality: Right;  Elevation of Right Fronto-Temporal Skull Fracture   Family History  Problem Relation Age of Onset  . Deep vein thrombosis Daughter   . Clotting disorder Daughter   . Deep vein thrombosis Son   . Diabetes Mother   . Deep vein thrombosis Sister   . Deep vein thrombosis Brother    Social History   Socioeconomic History  . Marital status: Married    Spouse name: Not on file  . Number of children: 2  . Years of education: 16  . Highest education level: Bachelor's degree (e.g., BA, AB, BS)  Occupational History  . Occupation: retired   Tobacco Use  . Smoking status: Never Smoker  . Smokeless tobacco: Never Used  Substance and Sexual Activity  . Alcohol use: Yes    Alcohol/week: 0.0 standard drinks    Comment: rarely  . Drug use: No  . Sexual activity: Yes    Birth control/protection: Post-menopausal  Other Topics Concern  . Not on file  Social History Narrative   [place]    Patient has two children and some grandchildren.    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   Patient has a part time job to keep  busy at a [employer].    Patient enjoys spending time with family, cooking, and reading.    Social Determinants of Health   Financial Resource Strain: Not on file  Food Insecurity: No Food Insecurity  . Worried About Charity fundraiser in the Last Year: Never true  . Ran Out of Food in the Last Year: Never true  Transportation Needs: No Transportation Needs  . Lack of Transportation (Medical): No  . Lack of Transportation (Non-Medical): No  Physical Activity: Insufficiently Active  . Days of Exercise per Week: 3 days  . Minutes of Exercise per Session: 20 min  Stress: No Stress Concern Present  . Feeling of Stress : Only a little  Social Connections:  Moderately Integrated  . Frequency of Communication with Friends and Family: More than three times a week  . Frequency of Social Gatherings with Friends and Family: More than three times a week  . Attends Religious Services: More than 4 times per year  . Active Member of Clubs or Organizations: Not on file  . Attends Archivist Meetings: Never  . Marital Status: Married   Outpatient Encounter Medications as of 09/22/2020  Medication Sig  . atorvastatin (LIPITOR) 80 MG tablet Take 1 tablet (80 mg total) by mouth daily.  . Blood Glucose Monitoring Suppl (ONE TOUCH ULTRA 2) w/Device KIT Check blood sugar fasting in the morning and one other time two hours after a meal.  . ELIQUIS 5 MG TABS tablet TAKE 1 TABLET(5 MG) BY MOUTH TWICE DAILY  . glucose blood ([employer] GLUCOSE METER TEST STRIPS) test strip Use as instructed  . hydrochlorothiazide (HYDRODIURIL) 25 MG tablet Take 1 tablet (25 mg total) by mouth daily.  Marland Kitchen lisinopril (ZESTRIL) 10 MG tablet TAKE 2 TABLETS(20 MG) BY MOUTH AT BEDTIME  . lisinopril (ZESTRIL) 40 MG tablet Take 1 tablet (40 mg total) by mouth daily.  . metFORMIN (GLUCOPHAGE-XR) 500 MG 24 hr tablet Take 2 tablets (1,000 mg total) by mouth in the morning and at bedtime.  . ONE TOUCH ULTRA TEST test strip CHECK BLOOD SUGAR FASTING IN THE MORNING AND ONE OTHER TIME 2 HOURS AFTER A MEAL  . ONETOUCH DELICA LANCETS 01U MISC CHECK BLOOD SUGAR FASTING IN THE MORNING AND ONE OTHER TIME 2 HOURS AFTER A MEAL  . polyethylene glycol (MIRALAX / GLYCOLAX) packet Take 17 g by mouth daily as needed (for constipation).  . sildenafil (VIAGRA) 100 MG tablet TAKE 1/2 TO 1 TABLET BY MOUTH DAILY AS NEEDED FOR ERECTILE DYSFUNCTION  . tamsulosin (FLOMAX) 0.4 MG CAPS capsule Take 1 capsule (0.4 mg total) by mouth daily.  . traMADol (ULTRAM) 50 MG tablet Take 50-100 mg by mouth every 6 (six) hours as needed for pain.  . benzonatate (TESSALON) 100 MG capsule Take 1 capsule (100 mg total) by mouth every  8 (eight) hours. (Patient not taking: Reported on 09/22/2020)  . Docusate Sodium (DSS) 100 MG CAPS Take 100 mg by mouth 2 (two) times daily as needed (for constipation). (Patient not taking: Reported on 09/22/2020)   No facility-administered encounter medications on file as of 09/22/2020.   Activities of Daily Living In your present state of health, do you have any difficulty performing the following activities: 09/22/2020  Hearing? N  Vision? N  Difficulty concentrating or making decisions? N  Walking or climbing stairs? N  Dressing or bathing? N  Doing errands, shopping? N  Preparing Food and eating ? N  Using the Toilet? N  Do you  have problems with loss of bowel control? N  Managing your Medications? N  Managing your Finances? N  Housekeeping or managing your Housekeeping? N  Some recent data might be hidden   Patient Care Team: Matilde Haymaker, MD as PCP - General (Family Medicine)   Assessment:   This is a routine wellness examination for Audra.  Exercise Activities and Dietary recommendations Current Exercise Habits: Home exercise routine, Time (Minutes): 20, Frequency (Times/Week): 3, Weekly Exercise (Minutes/Week): 60, Intensity: Moderate  Goals    . Blood Pressure < 140/90    . HEMOGLOBIN A1C < 7      Fall Risk Fall Risk  09/22/2020 09/22/2020 04/10/2020 06/10/2019 05/13/2019  Falls in the past year? 0 0 0 0 0  Number falls in past yr: - - - 0 -   Is the patient's home free of loose throw rugs in walkways, pet beds, electrical cords, etc?   no      Grab bars in the bathroom? no      Handrails on the stairs?   no      Adequate lighting?   no  Patient rating of health (0-10): 8  Depression Screen PHQ 2/9 Scores 09/22/2020 08/18/2020 06/10/2019 05/13/2019  PHQ - 2 Score 0 0 0 0  PHQ- 9 Score - 2 - -   Cognitive Function  6CIT Screen 09/22/2020  What Year? 0 points  What month? 0 points  What time? 0 points  Count back from 20 0 points  Months in reverse 0 points   Repeat phrase 0 points  Total Score 0   Immunization History  Administered Date(s) Administered  . Fluad Quad(high Dose 65+) 03/22/2019, 04/10/2020  . PFIZER(Purple Top)SARS-COV-2 Vaccination 08/10/2019, 09/02/2019, 04/10/2020  . Pneumococcal Conjugate-13 06/10/2019  . Pneumococcal Polysaccharide-23 08/18/2020  . Tdap 09/07/2012   Screening Tests Health Maintenance  Topic Date Due  . COLONOSCOPY (Pts 45-16yr Insurance coverage will need to be confirmed)  Never done  . OPHTHALMOLOGY EXAM  10/28/2020  . HEMOGLOBIN A1C  12/28/2020  . FOOT EXAM  07/23/2021  . TETANUS/TDAP  09/08/2022  . INFLUENZA VACCINE  Completed  . COVID-19 Vaccine  Completed  . Hepatitis C Screening  Completed  . PNA vac Low Risk Adult  Completed  . HPV VACCINES  Aged Out   Cancer Screenings: Lung: Low Dose CT Chest recommended if Age 69-80years, 30 pack-year currently smoking OR have quit w/in 15years. Patient does not qualify. Colorectal: In process of scheduling  Additional Screenings: Hepatitis C Screening: Completed  Plan:  PCP apt 09/30/2020. [facility].  Schedule your colonoscopy apt.  Check your blood pressure at home periodically.   I have personally reviewed and noted the following in the patient's chart:   . Medical and social history . Use of alcohol, tobacco or illicit drugs  . Current medications and supplements . Functional ability and status . Nutritional status . Physical activity . Advanced directives . List of other physicians . Hospitalizations, surgeries, and ER visits in previous 12 months . Vitals . Screenings to include cognitive, depression, and falls . Referrals and appointments  In addition, I have reviewed and discussed with patient certain preventive protocols, quality metrics, and best practice recommendations. A written personalized care plan for preventive services as well as general preventive health recommendations  were provided to patient.  EDorna Bloom CCadiz 09/22/2020    I have reviewed this visit and agree with the documentation.  Matilde Haymaker, MD

## 2020-09-22 NOTE — Patient Instructions (Addendum)
You spoke to Frank Scott, [place] for your annual wellness visit.  We discussed goals: Goals    . Blood Pressure < 140/90    . HEMOGLOBIN A1C < 7      We also discussed recommended health maintenance. As discussed, you are pretty much up to date with everything! Schedule your colonoscopy!   Health Maintenance  Topic Date Due  . COLONOSCOPY (Pts 45-78yrs Insurance coverage will need to be confirmed)  Never done  . OPHTHALMOLOGY EXAM  10/28/2020  . HEMOGLOBIN A1C  12/28/2020  . FOOT EXAM  07/23/2021  . TETANUS/TDAP  09/08/2022  . INFLUENZA VACCINE  Completed  . COVID-19 Vaccine  Completed  . Hepatitis C Screening  Completed  . PNA vac Low Risk Adult  Completed  . HPV VACCINES  Aged Out   PCP apt 09/30/2020. Bring advance directive to [facility] to be added to your chart.  Schedule your colonoscopy apt.  Check your blood pressure at home periodically.   Health Maintenance, Male Adopting a healthy lifestyle and getting preventive care are important in promoting health and wellness. Ask your health care provider about:  The right schedule for you to have regular tests and exams.  Things you can do on your own to prevent diseases and keep yourself healthy. What should I know about diet, weight, and exercise? Eat a healthy diet  Eat a diet that includes plenty of vegetables, fruits, low-fat dairy products, and lean protein.  Do not eat a lot of foods that are high in solid fats, added sugars, or sodium.   Maintain a healthy weight Body mass index (BMI) is a measurement that can be used to identify possible weight problems. It estimates body fat based on height and weight. Your health care provider can help determine your BMI and help you achieve or maintain a healthy weight. Get regular exercise Get regular exercise. This is one of the most important things you can do for your health. Most adults should:  Exercise for at least 150 minutes each week. The exercise should increase your heart  rate and make you sweat (moderate-intensity exercise).  Do strengthening exercises at least twice a week. This is in addition to the moderate-intensity exercise.  Spend less time sitting. Even light physical activity can be beneficial. Watch cholesterol and blood lipids Have your blood tested for lipids and cholesterol at 69 years of age, then have this test every 5 years. You may need to have your cholesterol levels checked more often if:  Your lipid or cholesterol levels are high.  You are older than 70 years of age.  You are at high risk for heart disease. What should I know about cancer screening? Many types of cancers can be detected early and may often be prevented. Depending on your health history and family history, you may need to have cancer screening at various ages. This may include screening for:  Colorectal cancer.  Prostate cancer.  Skin cancer.  Lung cancer. What should I know about heart disease, diabetes, and high blood pressure? Blood pressure and heart disease  High blood pressure causes heart disease and increases the risk of stroke. This is more likely to develop in people who have high blood pressure readings, are of African descent, or are overweight.  Talk with your health care provider about your target blood pressure readings.  Have your blood pressure checked: ? Every 3-5 years if you are 7-43 years of age. ? Every year if you are 39 years old  or older.  If you are between the ages of 47 and 48 and are a current or former smoker, ask your health care provider if you should have a one-time screening for abdominal aortic aneurysm (AAA). Diabetes Have regular diabetes screenings. This checks your fasting blood sugar level. Have the screening done:  Once every three years after age 37 if you are at a normal weight and have a low risk for diabetes.  More often and at a younger age if you are overweight or have a high risk for diabetes. What should I  know about preventing infection? Hepatitis B If you have a higher risk for hepatitis B, you should be screened for this virus. Talk with your health care provider to find out if you are at risk for hepatitis B infection. Hepatitis C Blood testing is recommended for:  Everyone born from [birth year] through 1964.  Anyone with known risk factors for hepatitis C. Sexually transmitted infections (STIs)  You should be screened each year for STIs, including gonorrhea and chlamydia, if: ? You are sexually active and are younger than 69 years of age. ? You are older than 69 years of age and your health care provider tells you that you are at risk for this type of infection. ? Your sexual activity has changed since you were last screened, and you are at increased risk for chlamydia or gonorrhea. Ask your health care provider if you are at risk.  Ask your health care provider about whether you are at high risk for HIV. Your health care provider may recommend a prescription medicine to help prevent HIV infection. If you choose to take medicine to prevent HIV, you should first get tested for HIV. You should then be tested every 3 months for as long as you are taking the medicine. Follow these instructions at home: Lifestyle  Do not use any products that contain nicotine or tobacco, such as cigarettes, e-cigarettes, and chewing tobacco. If you need help quitting, ask your health care provider.  Do not use street drugs.  Do not share needles.  Ask your health care provider for help if you need support or information about quitting drugs. Alcohol use  Do not drink alcohol if your health care provider tells you not to drink.  If you drink alcohol: ? Limit how much you have to 0-2 drinks a day. ? Be aware of how much alcohol is in your drink. In the [place] one drink equals one 12 oz bottle of beer (355 mL), one 5 oz glass of wine (148 mL), or one 1 oz glass of hard liquor (44 mL). General  instructions  Schedule regular health, dental, and eye exams.  Stay current with your vaccines.  Tell your health care provider if: ? You often feel depressed. ? You have ever been abused or do not feel safe at home. Summary  Adopting a healthy lifestyle and getting preventive care are important in promoting health and wellness.  Follow your health care provider's instructions about healthy diet, exercising, and getting tested or screened for diseases.  Follow your health care provider's instructions on monitoring your cholesterol and blood pressure. This information is not intended to replace advice given to you by your health care provider. Make sure you discuss any questions you have with your health care provider. Document Revised: 06/06/2018 Document Reviewed: 06/06/2018 Elsevier Patient Education  2021 [place].  Our clinic's number is [phone]. Please call with questions or concerns about what we discussed today.

## 2020-09-30 ENCOUNTER — Other Ambulatory Visit: Payer: Self-pay

## 2020-09-30 ENCOUNTER — Ambulatory Visit (INDEPENDENT_AMBULATORY_CARE_PROVIDER_SITE_OTHER): Payer: Medicare Other | Admitting: Family Medicine

## 2020-09-30 ENCOUNTER — Encounter: Payer: Self-pay | Admitting: Family Medicine

## 2020-09-30 ENCOUNTER — Ambulatory Visit (INDEPENDENT_AMBULATORY_CARE_PROVIDER_SITE_OTHER): Payer: BC Managed Care – PPO

## 2020-09-30 VITALS — BP 122/72 | HR 72 | Ht 72.0 in | Wt 277.8 lb

## 2020-09-30 DIAGNOSIS — I10 Essential (primary) hypertension: Secondary | ICD-10-CM

## 2020-09-30 DIAGNOSIS — E119 Type 2 diabetes mellitus without complications: Secondary | ICD-10-CM | POA: Diagnosis not present

## 2020-09-30 DIAGNOSIS — Z23 Encounter for immunization: Secondary | ICD-10-CM | POA: Diagnosis not present

## 2020-09-30 LAB — POCT GLYCOSYLATED HEMOGLOBIN (HGB A1C): HbA1c, POC (controlled diabetic range): 8.3 % — AB (ref 0.0–7.0)

## 2020-09-30 NOTE — Progress Notes (Signed)
    SUBJECTIVE:   CHIEF COMPLAINT / HPI:   Hypertension His current medications include: -Hydrochlorothiazide 25 mg daily -Lisinopril 40 mg daily This is a big increase from his last visit.  He has been taking his medications regularly.  He had 1 mistake where he took too many pills and felt lightheaded but has otherwise been doing well.  This mistake was result of confusing the 2 different doses that he has for lisinopril.  He now has his medications under control.  Diabetes His current medications include: -Metformin 500 mg twice daily His Metformin as prescribed 1000 mg twice daily.  He reports that he is only taking 500 twice daily.  He was apparently unaware of the change in dose.   PERTINENT  PMH / PSH: Diabetes, hypertension, history of DVT  OBJECTIVE:   BP 122/72   Pulse 72   Ht 6' (1.829 m)   Wt 277 lb 12.8 oz (126 kg)   SpO2 99%   BMI 37.68 kg/m    General: Alert and cooperative and appears to be in no acute distress Cardio: Normal S1 and S2, no S3 or S4. Rhythm is regular. No murmurs or rubs.   Pulm: Clear to auscultation bilaterally, no crackles, wheezing, or diminished breath sounds. Normal respiratory effort Abdomen: Bowel sounds normal. Abdomen soft and non-tender.  Extremities: 2+ LE edema warm/ well perfused.   Neuro: Cranial nerves grossly intact  ASSESSMENT/PLAN:   HTN (hypertension) Now at goal.  I would like him under 130/80.  He is currently maxed out on 2 antihypertensives.  No need to add additional medication at this time. -Continue HCTZ and lisinopril -Follow-up BMP  Type 2 diabetes mellitus (HCC) A1c 8.1 today.  Mildly worsened.  We discussed increasing his Metformin to 1000 mg twice daily. -Increase Metformin to 1000 mg twice daily -Follow-up in 3 months     Matilde Haymaker, MD [place]

## 2020-09-30 NOTE — Assessment & Plan Note (Signed)
Now at goal.  I would like him under 130/80.  He is currently maxed out on 2 antihypertensives.  No need to add additional medication at this time. -Continue HCTZ and lisinopril -Follow-up BMP

## 2020-09-30 NOTE — Assessment & Plan Note (Signed)
A1c 8.1 today.  Mildly worsened.  We discussed increasing his Metformin to 1000 mg twice daily. -Increase Metformin to 1000 mg twice daily -Follow-up in 3 months

## 2020-10-01 LAB — BASIC METABOLIC PANEL
BUN/Creatinine Ratio: 10 (ref 10–24)
BUN: 12 mg/dL (ref 8–27)
CO2: 24 mmol/L (ref 20–29)
Calcium: 9 mg/dL (ref 8.6–10.2)
Chloride: 100 mmol/L (ref 96–106)
Creatinine, Ser: 1.19 mg/dL (ref 0.76–1.27)
Glucose: 210 mg/dL — ABNORMAL HIGH (ref 65–99)
Potassium: 4.2 mmol/L (ref 3.5–5.2)
Sodium: 142 mmol/L (ref 134–144)
eGFR: 67 mL/min/{1.73_m2} (ref >59)

## 2020-10-02 ENCOUNTER — Encounter: Payer: Self-pay | Admitting: Family Medicine

## 2020-10-13 ENCOUNTER — Other Ambulatory Visit: Payer: Self-pay | Admitting: Family Medicine

## 2020-10-15 ENCOUNTER — Encounter: Payer: Self-pay | Admitting: Gastroenterology

## 2020-10-15 ENCOUNTER — Telehealth: Payer: Self-pay

## 2020-10-15 ENCOUNTER — Ambulatory Visit (INDEPENDENT_AMBULATORY_CARE_PROVIDER_SITE_OTHER): Payer: Medicare Other | Admitting: Gastroenterology

## 2020-10-15 VITALS — BP 150/82 | HR 88 | Ht 70.5 in | Wt 278.1 lb

## 2020-10-15 DIAGNOSIS — Z01818 Encounter for other preprocedural examination: Secondary | ICD-10-CM

## 2020-10-15 DIAGNOSIS — Z1211 Encounter for screening for malignant neoplasm of colon: Secondary | ICD-10-CM

## 2020-10-15 DIAGNOSIS — Z7901 Long term (current) use of anticoagulants: Secondary | ICD-10-CM

## 2020-10-15 NOTE — Progress Notes (Signed)
Following sent to PCP:     [facility] [address] [place], [place]  [address] Phone:  [phone]   Fax:  [phone]    Pre-operative Risk Assessment  For Eliquis   Frank Scott [birth year] [id number]  Procedure: COLONOSCOPY Anesthesia type:  MAC Procedure Date: 01/12/21 Provider: Dr. Tarri Glenn  Type of Clearance needed: Pharmacy  Medication(s) needing held: Eliquis   Length of time for medication to be held: 2 days  Please review request and advise by either responding to this message or by sending your response to the fax # provided below.  Thank you,  [facility]  Phone: [phone] Fax: [phone] ATTENTION: Naveed Humphres, LPN

## 2020-10-15 NOTE — Progress Notes (Signed)
Referring Provider: Matilde Haymaker, MD Primary Care Physician:  Matilde Haymaker, MD  Reason for Consultation:  Need for colonoscopy   IMPRESSION:  Need for colon cancer screening Long term Eliquis use for recurrent DVTs  No known family history of colon cancer or polyps   I have recommended holding Eliquis for 2 days before endoscopy.  I discussed with the patient that there is a low, but real, risk of a cardiovascular event such as heart attack, stroke, or recurrrent embolism/thrombosis while off Eliquis. Will communicate with Dr. Pilar Plate to confirm that holding the Eliquis is appropriate at this time.      PLAN: Colonoscopy after an Eliquis washout  Please see the "Patient Instructions" section for addition details about the plan.  HPI: Frank Scott is a 70 y.o. male referred for colon cancer screening.  The history is obtained through the patient and review of his electronic health record.  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  No prior colon cancer screening.  No prior endoscopy. GI review of systems is negative.  No known family history of colon cancer or polyps. No family history of uterine/endometrial cancer, pancreatic cancer or gastric/stomach cancer.   Past Medical History:  Diagnosis Date  . DVT of deep femoral vein ([place]) 1980s  . Hypertension     Past Surgical History:  Procedure Laterality Date  . APPENDECTOMY  1980s  . CRANIOTOMY Right 09/07/2012   Procedure: Elevation of Decompressed Skull [place] Forward;  Surgeon: Faythe Ghee, MD;  Location: [facility];  Service: Neurosurgery;  Laterality: Right;  Elevation of Right Fronto-Temporal Skull Fracture    Current Outpatient Medications  Medication Sig Dispense Refill  . atorvastatin (LIPITOR) 80 MG tablet Take 1 tablet (80 mg total) by mouth daily. 90 tablet 3  .  benzonatate (TESSALON) 100 MG capsule Take 1 capsule (100 mg total) by mouth every 8 (eight) hours. (Patient not taking: Reported on 09/22/2020) 21 capsule 0  . Blood Glucose Monitoring Suppl (ONE TOUCH ULTRA 2) w/Device KIT Check blood sugar fasting in the morning and one other time two hours after a meal. 1 each 1  . Docusate Sodium (DSS) 100 MG CAPS Take 100 mg by mouth 2 (two) times daily as needed (for constipation). (Patient not taking: Reported on 09/22/2020)    . ELIQUIS 5 MG TABS tablet TAKE 1 TABLET(5 MG) BY MOUTH TWICE DAILY 120 tablet 2  . glucose blood ([employer] GLUCOSE METER TEST STRIPS) test strip Use as instructed 100 each 12  . hydrochlorothiazide (HYDRODIURIL) 25 MG tablet Take 1 tablet (25 mg total) by mouth daily. 90 tablet 3  . lisinopril (ZESTRIL) 40 MG tablet Take 1 tablet (40 mg total) by mouth daily. 90 tablet 3  . metFORMIN (GLUCOPHAGE-XR) 500 MG 24 hr tablet Take 2 tablets (1,000 mg total) by mouth in the morning and at bedtime. 180 tablet 3  . ONE TOUCH ULTRA TEST test strip CHECK BLOOD SUGAR FASTING IN THE MORNING AND ONE OTHER TIME 2 HOURS AFTER A MEAL 100 each 3  . ONETOUCH DELICA LANCETS 83F MISC CHECK BLOOD SUGAR FASTING IN THE MORNING AND ONE OTHER TIME 2 HOURS AFTER A MEAL 100 each 0  . polyethylene glycol (MIRALAX / GLYCOLAX) packet Take 17 g by mouth daily as needed (for constipation).    . sildenafil (VIAGRA) 100 MG tablet TAKE 1/2 TO 1 TABLET BY MOUTH DAILY  AS NEEDED FOR ERECTILE DYSFUNCTION 30 tablet 0  . tamsulosin (FLOMAX) 0.4 MG CAPS capsule TAKE 1 CAPSULE(0.4 MG) BY MOUTH DAILY 30 capsule 3  . traMADol (ULTRAM) 50 MG tablet Take 50-100 mg by mouth every 6 (six) hours as needed for pain.     No current facility-administered medications for this visit.    Allergies as of 10/15/2020  . (No Known Allergies)    Family History  Problem Relation Age of Onset  . Deep vein thrombosis Daughter   . Clotting disorder Daughter   . Deep vein thrombosis Son   .  Diabetes Mother   . Deep vein thrombosis Sister   . Deep vein thrombosis Brother      Review of Systems: 12 system ROS is negative except as noted above.   Physical Exam: General:   Alert,  well-nourished, pleasant and cooperative in NAD.  Well healed scar on the right side of the head. Head:  Normocephalic and atraumatic. Eyes:  Sclera clear, no icterus.   Conjunctiva pink. Abdomen:  Soft, nontender, nondistended, normal bowel sounds, no rebound or guarding. No hepatosplenomegaly.   Rectal:  Deferred  Msk:  Symmetrical. No boney deformities LAD: No inguinal or umbilical LAD Extremities:  No clubbing or edema. Neurologic:  Alert and  oriented x4;  grossly nonfocal Skin:  Intact without significant lesions or rashes. Psych:  Alert and cooperative. Normal mood and affect.    Jabar Krysiak L. Tarri Glenn, MD, MPH 10/15/2020, 3:22 PM

## 2020-10-15 NOTE — Patient Instructions (Addendum)
It was a pleasure to meet you today. Based on our discussion, I am providing you with my recommendations below:  RECOMMENDATION(S):   To better evaluate your symptoms, I am recommending a colonoscopy  COLONOSCOPY:   . You have been scheduled for a colonoscopy. Please follow written instructions given to you at your visit today.   PREP:   . Please pick up your prep supplies at the pharmacy within the next 1-3 days.  INHALERS:   . If you use inhalers (even only as needed), please bring them with you on the day of your procedure.  MEDICATIONS TO HOLD:  . We will contact your provider to request permission for you to hold Eliquis. Once we receive a response, you will be contacted by our office. If you do not hear from our office 1 week prior to your scheduled procedure, please contact our office at [phone].  Marland Kitchen Please refer to your prep instructions regarding holding your METFORMIN  COLONOSCOPY TIPS:  . To reduce nausea and dehydration, stay well hydrated for 3-4 days prior to the exam.  . To prevent skin/hemorrhoid irritation - prior to wiping, put A&Dointment or vaseline on the toilet paper. Marland Kitchen Keep a towel or pad on the bed.  Marland Kitchen BEFORE STARTING YOUR PREP, drink  64oz of clear liquids in the morning. This will help to flush the colon and will ensure you are well hydrated!!!!  NOTE - This is in addition to the fluids required for to complete your prep. . Use of a flavored hard candy, such as grape Anise Salvo, can counteract some of the flavor of the prep and may prevent some nausea.   FOLLOW UP:  . I will review your reports and inform you when you will need to follow up  BMI:  . If you are age 74 or older, your body mass index should be between 23-30. Your Body mass index is 39.34 kg/m. If this is out of the aforementioned range listed, please consider follow up with your Primary Care Provider.  Thank you for trusting me with your gastrointestinal care!    Thornton Park, MD, MPH

## 2020-10-15 NOTE — Telephone Encounter (Signed)
   [facility] [address] [place],   [address] Phone:  [phone]   Fax:  [phone]    Pre-operative Risk Assessment  For Eliquis   Frank Scott 1950-08-26 [id number]  Procedure: COLONOSCOPY Anesthesia type:  MAC Procedure Date: 01/12/21 Provider: Dr. Tarri Glenn  Type of Clearance needed: Pharmacy  Medication(s) needing held: Eliquis   Length of time for medication to be held: 2 days  Please review request and advise by either responding to this message or by sending your response to the fax # provided below.  Thank you,  [facility]  Phone: [phone] Fax: [phone] ATTENTION: Keiden Deskin, LPN

## 2020-10-16 NOTE — Telephone Encounter (Signed)
Frank Scott is currently anticoagulated for his known protein S deficiency which has previously caused thrombosis.  Stopping his Eliquis for 2 days will likely be low risk for rethrombosis although this certainly will increase his risk by marginal degree.  This is a low risk procedure and he is likely low risk for rethrombosis.    Matilde Haymaker, MD

## 2020-10-19 NOTE — Telephone Encounter (Signed)
Called pt to inform about Dr. Baxter Flattery response below. LVM requesting returned call

## 2020-10-19 NOTE — Telephone Encounter (Signed)
Pt returned call. Informed about Dr. Baxter Flattery response below. Verbalized acceptance and understanding.

## 2020-10-29 LAB — HM DIABETES EYE EXAM

## 2020-11-19 ENCOUNTER — Other Ambulatory Visit: Payer: Self-pay | Admitting: Family Medicine

## 2020-11-19 DIAGNOSIS — E785 Hyperlipidemia, unspecified: Secondary | ICD-10-CM

## 2021-01-12 ENCOUNTER — Ambulatory Visit (AMBULATORY_SURGERY_CENTER): Payer: Medicare Other | Admitting: Gastroenterology

## 2021-01-12 ENCOUNTER — Other Ambulatory Visit: Payer: Self-pay

## 2021-01-12 ENCOUNTER — Encounter: Payer: Self-pay | Admitting: Gastroenterology

## 2021-01-12 VITALS — BP 145/88 | HR 63 | Temp 98.7°F | Resp 20 | Ht 70.0 in | Wt 278.0 lb

## 2021-01-12 DIAGNOSIS — D123 Benign neoplasm of transverse colon: Secondary | ICD-10-CM | POA: Diagnosis not present

## 2021-01-12 DIAGNOSIS — Z1211 Encounter for screening for malignant neoplasm of colon: Secondary | ICD-10-CM

## 2021-01-12 MED ORDER — SODIUM CHLORIDE 0.9 % IV SOLN: 500.0000 mL | Freq: Once | INTRAVENOUS | Status: DC

## 2021-01-12 NOTE — Op Note (Signed)
[facility] Patient Name: Frank Scott Procedure Date: 01/12/2021 9:50 AM MRN: [id number] Endoscopist: Thornton Park MD, MD Age: 70 Referring MD:  Date of Birth: July 01, 1950 Gender: Male Account #: [id number] Procedure:                Colonoscopy Indications:              Screening for colorectal malignant neoplasm, This                            is the patient's first colonoscopy                           No known family history of colon cancer or polyps Medicines:                Monitored Anesthesia Care Procedure:                Pre-Anesthesia Assessment:                           - Prior to the procedure, a History and Physical                            was performed, and patient medications and                            allergies were reviewed. The patient's tolerance of                            previous anesthesia was also reviewed. The risks                            and benefits of the procedure and the sedation                            options and risks were discussed with the patient.                            All questions were answered, and informed consent                            was obtained. Prior Anticoagulants: The patient has                            taken Eliquis (apixaban), last dose was 2 days                            prior to procedure. ASA Grade Assessment: III - A                            patient with severe systemic disease. After                            reviewing the risks and benefits, the patient was  deemed in satisfactory condition to undergo the                            procedure.                           After obtaining informed consent, the colonoscope                            was passed under direct vision. Throughout the                            procedure, the patient's blood pressure, pulse, and                            oxygen saturations were monitored continuously. The                             CF [id number] #[id number] was introduced through the anus                            and advanced to the 3 cm into the ileum. A second                            forward view of the right colon was performed. The                            colonoscopy was performed without difficulty. The                            patient tolerated the procedure well. The quality                            of the bowel preparation was good. The terminal                            ileum, ileocecal valve, appendiceal orifice, and                            rectum were photographed. Scope In: 9:58:22 AM Scope Out: 10:16:52 AM Scope Withdrawal Time: 0 hours 15 minutes 24 seconds  Total Procedure Duration: 0 hours 18 minutes 30 seconds  Findings:                 Non-bleeding external and internal hemorrhoids were                            found.                           A 2 mm polyp was found in the splenic flexure. The                            polyp was sessile. The polyp was removed with a  cold snare. Resection and retrieval were complete.                            Estimated blood loss was minimal.                           A 3 mm polyp was found in the transverse colon. The                            polyp was sessile. The polyp was removed with a                            cold snare. Resection and retrieval were complete.                            Estimated blood loss was minimal.                           The exam was otherwise without abnormality on                            direct and retroflexion views. Complications:            No immediate complications. Estimated blood loss:                            Minimal. Estimated Blood Loss:     Estimated blood loss was minimal. Impression:               - Non-bleeding external and internal hemorrhoids.                           - One 2 mm polyp at the splenic flexure, removed                            with a cold  snare. Resected and retrieved.                           - One 3 mm polyp in the transverse colon, removed                            with a cold snare. Resected and retrieved.                           - The examination was otherwise normal on direct                            and retroflexion views. Recommendation:           - Patient has a contact number available for                            emergencies. The signs and symptoms of potential  delayed complications were discussed with the                            patient. Return to normal activities tomorrow.                            Written discharge instructions were provided to the                            patient.                           - Resume previous diet.                           - Continue present medications.                           - Resume Eliquis (apixaban) at prior dose tomorrow.                           - Await pathology results.                           - Repeat colonoscopy date to be determined after                            pending pathology results are reviewed for                            surveillance.                           - Emerging evidence supports eating a diet of                            fruits, vegetables, grains, calcium, and yogurt                            while reducing red meat and alcohol may reduce the                            risk of colon cancer.                           - Thank you for allowing me to be involved in your                            colon cancer prevention. Thornton Park MD, MD 01/12/2021 10:21:24 AM This report has been signed electronically.

## 2021-01-12 NOTE — Progress Notes (Signed)
Called to room to assist during endoscopic procedure.  Patient ID and intended procedure confirmed with present staff. Received instructions for my participation in the procedure from the performing physician.  

## 2021-01-12 NOTE — Progress Notes (Signed)
Medical history reviewed with no changes noted. VS assessed by C.W 

## 2021-01-12 NOTE — Patient Instructions (Signed)
YOU HAD AN ENDOSCOPIC PROCEDURE TODAY AT [facility]:   Refer to the procedure report that was given to you for any specific questions about what was found during the examination.  If the procedure report does not answer your questions, please call your gastroenterologist to clarify.  If you requested that your care partner not be given the details of your procedure findings, then the procedure report has been included in a sealed envelope for you to review at your convenience later.  YOU SHOULD EXPECT: Some feelings of bloating in the abdomen. Passage of more gas than usual.  Walking can help get rid of the air that was put into your GI tract during the procedure and reduce the bloating. If you had a lower endoscopy (such as a colonoscopy or flexible sigmoidoscopy) you may notice spotting of blood in your stool or on the toilet paper. If you underwent a bowel prep for your procedure, you may not have a normal bowel movement for a few days.  Please Note:  You might notice some irritation and congestion in your nose or some drainage.  This is from the oxygen used during your procedure.  There is no need for concern and it should clear up in a day or so.  SYMPTOMS TO REPORT IMMEDIATELY:  Following lower endoscopy (colonoscopy or flexible sigmoidoscopy):  Excessive amounts of blood in the stool  Significant tenderness or worsening of abdominal pains  Swelling of the abdomen that is new, acute  Fever of 100F or higher   For urgent or emergent issues, a gastroenterologist can be reached at any hour by calling [phone]. Do not use MyChart messaging for urgent concerns.    DIET:  We do recommend a small meal at first, but then you may proceed to your regular diet.  Drink plenty of fluids but you should avoid alcoholic beverages for 24 hours.  MEDICATIONS: Continue present medications. Resume Eliquis (apixaban) at prior dose tomorrow.  Please see handouts given to you by your  recovery nurse.  Thank you for allowing [place] to provide for your healthcare needs today.  ACTIVITY:  You should plan to take it easy for the rest of today and you should NOT DRIVE or use heavy machinery until tomorrow (because of the sedation medicines used during the test).    FOLLOW UP: Our staff will call the number listed on your records 48-72 hours following your procedure to check on you and address any questions or concerns that you may have regarding the information given to you following your procedure. If we do not reach you, we will leave a message.  We will attempt to reach you two times.  During this call, we will ask if you have developed any symptoms of COVID 19. If you develop any symptoms (ie: fever, flu-like symptoms, shortness of breath, cough etc.) before then, please call [phone].  If you test positive for Covid 19 in the 2 weeks post procedure, please call and report this information to [place].    If any biopsies were taken you will be contacted by phone or by letter within the next 1-3 weeks.  Please call us at [phone] if you have not heard about the biopsies in 3 weeks.    SIGNATURES/CONFIDENTIALITY: You and/or your care partner have signed paperwork which will be entered into your electronic medical record.  These signatures attest to the fact that that the information above on your After Visit Summary has been reviewed and is  understood.  Full responsibility of the confidentiality of this discharge information lies with you and/or your care-partner.

## 2021-01-14 ENCOUNTER — Telehealth: Payer: Self-pay

## 2021-01-14 NOTE — Telephone Encounter (Signed)
  Follow up Call-  Call back number 01/12/2021  Post procedure Call Back phone  # [phone]  Permission to leave phone message Yes  Some recent data might be hidden     Patient questions:  Do you have a fever, pain , or abdominal swelling? No. Pain Score  0 *  Have you tolerated food without any problems? Yes.    Have you been able to return to your normal activities? Yes.    Do you have any questions about your discharge instructions: Diet   No. Medications  No. Follow up visit  No.  Do you have questions or concerns about your Care? No.  Actions: * If pain score is 4 or above: No action needed, pain <4.  Have you developed a fever since your procedure? no  2.   Have you had an respiratory symptoms (SOB or cough) since your procedure? no  3.   Have you tested positive for COVID 19 since your procedure no  4.   Have you had any family members/close contacts diagnosed with the COVID 19 since your procedure?  no   If yes to any of these questions please route to Joylene John, RN and Joella Prince, RN

## 2021-01-18 ENCOUNTER — Encounter: Payer: Self-pay | Admitting: Gastroenterology

## 2021-02-02 ENCOUNTER — Other Ambulatory Visit: Payer: Self-pay

## 2021-02-04 MED ORDER — APIXABAN 5 MG PO TABS: ORAL_TABLET | ORAL | 2 refills | Status: DC

## 2021-02-04 NOTE — Telephone Encounter (Signed)
Please have patient schedule follow-up appointment for medication/to meet PCP

## 2021-05-11 ENCOUNTER — Other Ambulatory Visit: Payer: Self-pay | Admitting: Student

## 2021-05-12 MED ORDER — LISINOPRIL 40 MG PO TABS: 40.0000 mg | ORAL_TABLET | Freq: Every day | ORAL | 0 refills | Status: DC

## 2021-05-26 ENCOUNTER — Ambulatory Visit (HOSPITAL_COMMUNITY)
Admission: EM | Admit: 2021-05-26 | Discharge: 2021-05-26 | Disposition: A | Discharge status: Home / Self Care | Payer: BC Managed Care – PPO | Attending: Emergency Medicine | Admitting: Emergency Medicine

## 2021-05-26 ENCOUNTER — Encounter (HOSPITAL_COMMUNITY): Payer: Self-pay | Admitting: *Deleted

## 2021-05-26 ENCOUNTER — Ambulatory Visit (HOSPITAL_COMMUNITY): Payer: BC Managed Care – PPO

## 2021-05-26 ENCOUNTER — Other Ambulatory Visit: Payer: Self-pay

## 2021-05-26 DIAGNOSIS — R0981 Nasal congestion: Secondary | ICD-10-CM | POA: Diagnosis not present

## 2021-05-26 DIAGNOSIS — J0100 Acute maxillary sinusitis, unspecified: Secondary | ICD-10-CM

## 2021-05-26 DIAGNOSIS — R051 Acute cough: Secondary | ICD-10-CM | POA: Diagnosis not present

## 2021-05-26 MED ORDER — FLUTICASONE PROPIONATE 50 MCG/ACT NA SUSP: 1.0000 | Freq: Every day | NASAL | 2 refills | Status: DC

## 2021-05-26 MED ORDER — AMOXICILLIN-POT CLAVULANATE 875-125 MG PO TABS: 1.0000 | ORAL_TABLET | Freq: Two times a day (BID) | ORAL | 0 refills | Status: DC

## 2021-05-26 NOTE — ED Provider Notes (Signed)
Frank Scott    CSN: [id number] Arrival date & time: 05/26/21  7867      History   Chief Complaint Chief Complaint  Patient presents with   Nasal Congestion    HPI HANCEL ION is a 70 y.o. male.   Cough, congestion and nasal pressure with slight headache intermit for 1 week. Denies any chest pain, no n/v/d no weakness. Pt states that he has been taking otc meds which have helped minimally.    Past Medical History:  Diagnosis Date   Blood transfusion declined because patient is Jehovah's Witness    DVT of deep femoral vein ([place]) 1980s   Hypertension     Patient Active Problem List   Diagnosis Date Noted   Benign prostate hyperplasia 04/10/2020   DVT (deep venous thrombosis) ([place]) 04/24/2019   Hyperlipidemia 11/16/2015   Type 2 diabetes mellitus ([place]) 11/05/2015   Erectile dysfunction 10/15/2015   HTN (hypertension) 10/04/2012   Traumatic intracerebral hemorrhage 09/07/2012   OBESITY 11/02/2009    Past Surgical History:  Procedure Laterality Date   APPENDECTOMY  1980s   CRANIOTOMY Right 09/07/2012   Procedure: Elevation of Decompressed Skull [place] Forward;  Surgeon: Faythe Ghee, MD;  Location: [facility];  Service: Neurosurgery;  Laterality: Right;  Elevation of Right Fronto-Temporal Skull Fracture       Home Medications    Prior to Admission medications   Medication Sig Start Date End Date Taking? Authorizing Provider  amoxicillin-clavulanate (AUGMENTIN) 875-125 MG tablet Take 1 tablet by mouth every 12 (twelve) hours. 05/26/21  Yes Marney Setting, NP  fluticasone (FLONASE) 50 MCG/ACT nasal spray Place 1 spray into both nostrils daily. 05/26/21  Yes Marney Setting, NP  atorvastatin (LIPITOR) 80 MG tablet TAKE 1 TABLET(80 MG) BY MOUTH DAILY 11/19/20   Matilde Haymaker, MD  benzonatate (TESSALON) 100 MG capsule Take 1 capsule (100 mg total) by mouth every 8 (eight) hours. Patient not taking: Reported on 01/12/2021 06/30/17   Ok Edwards, PA-C   Blood Glucose Monitoring Suppl (ONE TOUCH ULTRA 2) w/Device KIT Check blood sugar fasting in the morning and one other time two hours after a meal. 11/04/15   Nicolette Bang, MD  ELIQUIS 5 MG TABS tablet TAKE 1 TABLET(5 MG) BY MOUTH TWICE DAILY 05/12/21   Gerrit Heck, MD  glucose blood ([facility] GLUCOSE METER TEST STRIPS) test strip Use as instructed 04/03/19   Nuala Alpha, MD  hydrochlorothiazide (HYDRODIURIL) 25 MG tablet Take 1 tablet (25 mg total) by mouth daily. 08/18/20   Matilde Haymaker, MD  lisinopril (ZESTRIL) 40 MG tablet Take 1 tablet (40 mg total) by mouth daily. 05/12/21   Gerrit Heck, MD  metFORMIN (GLUCOPHAGE-XR) 500 MG 24 hr tablet Take 2 tablets (1,000 mg total) by mouth in the morning and at bedtime. 04/10/20   Matilde Haymaker, MD  ONE TOUCH ULTRA TEST test strip CHECK BLOOD SUGAR FASTING IN THE MORNING AND ONE OTHER TIME 2 HOURS AFTER A MEAL 12/23/16   Nicolette Bang, MD  [facility] DELICA LANCETS 67M MISC CHECK BLOOD SUGAR FASTING IN THE MORNING AND ONE OTHER TIME 2 HOURS AFTER A MEAL 04/14/17   Nicolette Bang, MD  sildenafil (VIAGRA) 100 MG tablet TAKE 1/2 TO 1 TABLET BY MOUTH DAILY AS NEEDED FOR ERECTILE DYSFUNCTION Patient not taking: Reported on 01/12/2021 06/12/20   Matilde Haymaker, MD  tamsulosin (FLOMAX) 0.4 MG CAPS capsule TAKE 1 CAPSULE(0.4 MG) BY MOUTH DAILY 10/14/20   Matilde Haymaker, MD  Family History Family History  Problem Relation Age of Onset   Diabetes Mother    Hypertension Mother    Stroke Father    Deep vein thrombosis Sister    Deep vein thrombosis Brother    Deep vein thrombosis Daughter    Clotting disorder Daughter    Deep vein thrombosis Son     Social History Social History   Tobacco Use   Smoking status: Never   Smokeless tobacco: Never  Vaping Use   Vaping Use: Never used  Substance Use Topics   Alcohol use: Yes    Alcohol/week: 0.0 standard drinks    Comment: occasional   Drug use: No     Allergies    Patient has no known allergies.   Review of Systems Review of Systems  Constitutional:  Negative for activity change, appetite change, fatigue and fever.  HENT:  Positive for congestion, postnasal drip, rhinorrhea, sinus pressure, sinus pain, sneezing and sore throat.   Eyes: Negative.   Respiratory:  Positive for cough.   Cardiovascular: Negative.   Gastrointestinal: Negative.   Genitourinary: Negative.   Skin: Negative.   Neurological:  Positive for weakness. Negative for dizziness and numbness.    Physical Exam Triage Vital Signs ED Triage Vitals  Enc Vitals Group     BP 05/26/21 1116 (!) 163/94     Pulse Rate 05/26/21 1116 65     Resp 05/26/21 1116 18     Temp 05/26/21 1116 98.2 F (36.8 C)     Temp src --      SpO2 05/26/21 1116 96 %     Weight --      Height --      Head Circumference --      Peak Flow --      Pain Score 05/26/21 1114 0     Pain Loc --      Pain Edu? --      Excl. in [place]? --    No data found.  Updated Vital Signs BP (!) 163/94   Pulse 65   Temp 98.2 F (36.8 C)   Resp 18   SpO2 96%   Visual Acuity Right Eye Distance:   Left Eye Distance:   Bilateral Distance:    Right Eye Near:   Left Eye Near:    Bilateral Near:     Physical Exam Constitutional:      Appearance: Normal appearance.  HENT:     Right Ear: Tympanic membrane normal.     Left Ear: Tympanic membrane normal.     Nose: Congestion and rhinorrhea present.     Mouth/Throat:     Mouth: Mucous membranes are moist.     Pharynx: Posterior oropharyngeal erythema present. No oropharyngeal exudate.  Eyes:     Pupils: Pupils are equal, round, and reactive to light.  Cardiovascular:     Rate and Rhythm: Normal rate.  Pulmonary:     Effort: Pulmonary effort is normal.     Breath sounds: Normal breath sounds.  Abdominal:     General: Abdomen is flat.  Musculoskeletal:        General: Normal range of motion.  Skin:    General: Skin is warm.  Neurological:     General: No  focal deficit present.     Mental Status: He is alert. Mental status is at baseline.     UC Treatments / Results  Labs (all labs ordered are listed, but only abnormal results are displayed) Labs Reviewed - No  data to display  EKG   Radiology No results found.  Procedures Procedures (including critical care time)  Medications Ordered in UC Medications - No data to display  Initial Impression / Assessment and Plan / UC Course  I have reviewed the triage vital signs and the nursing notes.  Pertinent labs & imaging results that were available during my care of the patient were reviewed by me and considered in my medical decision making (see chart for details).     If symptoms become worse go to er  Cont to take tyelnol and motrin as needed  Stay hydrated take the nausea meds as needed  Use inhaler as needed for cough or short of breath  Symptoms are more viral in nature they will have to resolve and treat the symptoms  Final Clinical Impressions(s) / UC Diagnoses   Final diagnoses:  Nasal congestion  Acute cough  Acute non-recurrent maxillary sinusitis   Discharge Instructions   None    ED Prescriptions     Medication Sig Dispense Auth. Provider   fluticasone (FLONASE) 50 MCG/ACT nasal spray Place 1 spray into both nostrils daily. 16 g Morley Kos L, NP   amoxicillin-clavulanate (AUGMENTIN) 875-125 MG tablet Take 1 tablet by mouth every 12 (twelve) hours. 14 tablet Marney Setting, NP      PDMP not reviewed this encounter.   Marney Setting, NP 05/26/21 1149

## 2021-05-26 NOTE — ED Triage Notes (Signed)
Pt reports head congestion for several weeks.

## 2021-05-27 ENCOUNTER — Ambulatory Visit (INDEPENDENT_AMBULATORY_CARE_PROVIDER_SITE_OTHER): Payer: Medicare Other

## 2021-05-27 DIAGNOSIS — Z23 Encounter for immunization: Secondary | ICD-10-CM | POA: Diagnosis not present

## 2021-09-08 ENCOUNTER — Other Ambulatory Visit: Payer: Self-pay | Admitting: Student

## 2021-10-14 ENCOUNTER — Ambulatory Visit: Payer: BC Managed Care – PPO | Admitting: Student

## 2021-10-14 NOTE — Progress Notes (Deleted)
    SUBJECTIVE:   CHIEF COMPLAINT / HPI:  Hypertension: Patient is a 71 y.o. male who present today for follow up of hypertension.   Patient endorses {rwdmsmartlistproblems:24882}  Home medications include: lisinopril 40 mg daily and HCTZ 25 mg daily Patient endorses taking these medications as prescribed.***  Most recent creatinine trend:  Lab Results  Component Value Date   CREATININE 1.19 09/30/2020   CREATININE 1.15 08/18/2020   CREATININE 1.03 07/07/2020   Patient {rwdoesdoesnot:24881} check blood pressure at home.  Patient {HAS HAS ZDG:38756} had a BMP in the past 1 year.   Diabetic Follow Up:  Home medications include: metformin 1000 mg BID Patient endorses taking these medications as prescribed.***  Most recent A1Cs:  Lab Results  Component Value Date   HGBA1C 8.3 (A) 09/30/2020   HGBA1C 7.0 (A) 06/30/2020   HGBA1C 8.3 (A) 04/10/2020   Last Microalbumin, LDL, Creatinine: Lab Results  Component Value Date   LDLCALC 102 (H) 04/10/2020   CREATININE 1.19 09/30/2020   Patient {rwdoesdoesnot:24881} check blood glucose on a regular basis.  Patient {rwisisnot:24883} up to date on diabetic eye. Patient {rwisisnot:24883} up to date on diabetic foot exam.      PERTINENT  PMH / PSH: ***  OBJECTIVE:   There were no vitals taken for this visit.  ***  ASSESSMENT/PLAN:   No problem-specific Assessment & Plan notes found for this encounter.     Gerrit Heck, MD [place]

## 2021-10-21 ENCOUNTER — Ambulatory Visit (INDEPENDENT_AMBULATORY_CARE_PROVIDER_SITE_OTHER): Payer: Medicare PPO | Admitting: Student

## 2021-10-21 ENCOUNTER — Encounter: Payer: Self-pay | Admitting: Student

## 2021-10-21 VITALS — BP 188/95 | HR 57 | Ht 70.0 in | Wt 274.8 lb

## 2021-10-21 DIAGNOSIS — Z0000 Encounter for general adult medical examination without abnormal findings: Secondary | ICD-10-CM | POA: Diagnosis not present

## 2021-10-21 DIAGNOSIS — I82409 Acute embolism and thrombosis of unspecified deep veins of unspecified lower extremity: Secondary | ICD-10-CM | POA: Diagnosis not present

## 2021-10-21 DIAGNOSIS — I10 Essential (primary) hypertension: Secondary | ICD-10-CM | POA: Diagnosis not present

## 2021-10-21 DIAGNOSIS — E119 Type 2 diabetes mellitus without complications: Secondary | ICD-10-CM | POA: Diagnosis not present

## 2021-10-21 DIAGNOSIS — I824Y9 Acute embolism and thrombosis of unspecified deep veins of unspecified proximal lower extremity: Secondary | ICD-10-CM | POA: Diagnosis not present

## 2021-10-21 LAB — POCT GLYCOSYLATED HEMOGLOBIN (HGB A1C): HbA1c, POC (controlled diabetic range): 7.3 % — AB (ref 0.0–7.0)

## 2021-10-21 MED ORDER — AMLODIPINE BESYLATE 5 MG PO TABS: 5.0000 mg | ORAL_TABLET | Freq: Every day | ORAL | 3 refills | Status: DC

## 2021-10-21 NOTE — Progress Notes (Signed)
SUBJECTIVE:   CHIEF COMPLAINT / HPI:   Diabetic Follow Up:  Patient endorses difficulties with medication compliance  Home medications include: metformin 1000 mg BID Patient endorses taking these medications only once a day due to GI upset from metformin (especially diarrhea) and he drives long hours for work. Interested in possibly changing medication  Most recent A1Cs:  Lab Results  Component Value Date   HGBA1C 7.3 (A) 10/21/2021   HGBA1C 8.3 (A) 09/30/2020   HGBA1C 7.0 (A) 06/30/2020   Last Microalbumin, LDL, Creatinine: Lab Results  Component Value Date   LDLCALC 98 10/21/2021   CREATININE 1.04 10/21/2021   Patient does not check blood glucose on a regular basis.  Patient is up to date on diabetic eye-no signs of retinopathy per patient but does have cataracts Patient is up to date on diabetic foot exam.  Hypertension:  Patient endorses difficulties with medication compliance  Home medications include: hydrochlorothiazide 25 mg daily, lisinopril 40 mg daily Patient endorses taking only the lisinopril 40 mg daily and has not been taking HCTZ d/t diuresis effects when he is on the road. Denies any headache, acute vision changes, shortness of breath or chest pain   Most recent creatinine trend:  Lab Results  Component Value Date   CREATININE 1.04 10/21/2021   CREATININE 1.19 09/30/2020   CREATININE 1.15 08/18/2020   Patient does check blood pressure at home systolics mostly in the [address]  Patient has not had a BMP in the past 1 year.  Recurrent DVTs D/t protein S deficiency. On lifelong anticoagulation with eliquis 5 mg daily. Denies any increase in leg swelling, leg/calf pain, redness or warmth. Denies any bleeding and no blood noted in stools.  PERTINENT  PMH / PSH: HLD  OBJECTIVE:   BP (!) 188/95   Pulse (!) 57   Ht '5\' 10"'$  (1.778 m)   Wt 274 lb 12.8 oz (124.6 kg)   SpO2 99%   BMI 39.43 kg/m    General: Well appearing, NAD, awake, alert,  responsive to questions Head: Normocephalic atraumatic CV: Regular rate and rhythm no murmurs rubs or gallops Respiratory: Clear to ausculation bilaterally, no wheezes rales or crackles, chest rises symmetrically,  no increased work of breathing Abdomen: Soft, non-tender, non-distended Extremities: Moves upper and lower extremities freely, 1+ pitting edema bilaterally in LE no warmth or erythema, no calf pain on palpation Neuro: No focal deficits Diabetic Foot Exam - Simple   Simple Foot Form Visual Inspection No deformities, no ulcerations, no other skin breakdown bilaterally: Yes Sensation Testing Intact to touch and monofilament testing bilaterally: Yes Pulse Check Posterior Tibialis and Dorsalis pulse intact bilaterally: Yes Comments     ASSESSMENT/PLAN:   HTN (hypertension) On repeat, patient continued to have elevated BP to 188/95. Currently only taking lisinopril 40 mg daily and not taking HCTZ d/t diuresis effects. Added on amlodipine 5 mg daily, discussed that this can increase leg swelling but we will see if this affects him. Will get BMP today and have him come back in 1 week for a BP recheck.  DVT (deep venous thrombosis) (HCC) Continue eliquis 5 mg daily, no clinical signs or symptoms of DVT today. Will obtain CBC today. ED precautions discussed with patient  Type 2 diabetes mellitus (HCC) metformin 1000 mg BID prescribed although not tolerating GI upset well. Patient would like to discuss with pharmacist other options for diabetes. A1c appropriate today at 7.3.Discussed nutrition changes as well, not interested in nutritionist referral today.  -BMP,  Lipid Panel  Gerrit Heck, MD [place]

## 2021-10-21 NOTE — Patient Instructions (Signed)
It was great to see you! Thank you for allowing me to participate in your care!  ? ?I recommend that you always bring your medications to each appointment as this makes it easy to ensure we are on the correct medications and helps [place] not miss when refills are needed. ? ?Our plans for today:  ?-We will continue you on lisinopril and also start another medication called amlodipine will take that once daily.  We will have you return in 1 week to recheck your blood pressure. ?-Your A1c was 7.3 today which is appropriate.  We can consider doing a different medication which pharmacist can talk to you about more, also nutrition is important for control as well ?-Please return to an ED if you start having a lot of pain in your legs, warmth or redness with an increase in swelling ? ?We are checking some labs today, I will call you if they are abnormal will send you a MyChart message or a letter if they are normal.  If you do not hear about your labs in the next 2 weeks please let us know. ? ?Take care and seek immediate care sooner if you develop any concerns. Please remember to show up 15 minutes before your scheduled appointment time! ? ?Gerrit Heck, MD ?[facility]  ?

## 2021-10-22 ENCOUNTER — Encounter: Payer: Self-pay | Admitting: Student

## 2021-10-22 LAB — LIPID PANEL
Chol/HDL Ratio: 3.4 ratio (ref 0.0–5.0)
Cholesterol, Total: 162 mg/dL (ref 100–199)
HDL: 47 mg/dL (ref >39)
LDL Chol Calc (NIH): 98 mg/dL (ref 0–99)
Triglycerides: 92 mg/dL (ref 0–149)
VLDL Cholesterol Cal: 17 mg/dL (ref 5–40)

## 2021-10-22 LAB — BASIC METABOLIC PANEL
BUN/Creatinine Ratio: 13 (ref 10–24)
BUN: 14 mg/dL (ref 8–27)
CO2: 25 mmol/L (ref 20–29)
Calcium: 9.4 mg/dL (ref 8.6–10.2)
Chloride: 101 mmol/L (ref 96–106)
Creatinine, Ser: 1.04 mg/dL (ref 0.76–1.27)
Glucose: 92 mg/dL (ref 70–99)
Potassium: 4.8 mmol/L (ref 3.5–5.2)
Sodium: 140 mmol/L (ref 134–144)
eGFR: 78 mL/min/{1.73_m2} (ref >59)

## 2021-10-22 LAB — CBC
Hematocrit: 43.2 % (ref 37.5–51.0)
Hemoglobin: 14.4 g/dL (ref 13.0–17.7)
MCH: 28 pg (ref 26.6–33.0)
MCHC: 33.3 g/dL (ref 31.5–35.7)
MCV: 84 fL (ref 79–97)
Platelets: 226 10*3/uL (ref 150–450)
RBC: 5.14 x10E6/uL (ref 4.14–5.80)
RDW: 12.3 % (ref 11.6–15.4)
WBC: 6 10*3/uL (ref 3.4–10.8)

## 2021-10-22 NOTE — Assessment & Plan Note (Signed)
metformin 1000 mg BID prescribed although not tolerating GI upset well. Patient would like to discuss with pharmacist other options for diabetes. A1c appropriate today at 7.3.Discussed nutrition changes as well, not interested in nutritionist referral today.  ?-BMP, Lipid Panel ?

## 2021-10-22 NOTE — Assessment & Plan Note (Addendum)
Continue eliquis 5 mg daily, no clinical signs or symptoms of DVT today. Will obtain CBC today. ED precautions discussed with patient ?

## 2021-10-22 NOTE — Assessment & Plan Note (Signed)
On repeat, patient continued to have elevated BP to 188/95. Currently only taking lisinopril 40 mg daily and not taking HCTZ d/t diuresis effects. Added on amlodipine 5 mg daily, discussed that this can increase leg swelling but we will see if this affects him. Will get BMP today and have him come back in 1 week for a BP recheck. ?

## 2021-11-01 NOTE — Progress Notes (Signed)
? ? ?  SUBJECTIVE:  ? ?CHIEF COMPLAINT / HPI:  ?Chief Complaint  ?Patient presents with  ? Diabetes  ? Hypertension  ?  ?HTN ?Seen by PCP 2 weeks ago. Takes lisinopril 40 mg. Started on amlodipine 5 mg. ?Home readings: 150s/80s ?Did have some dizziness this morning due working outside but otherwise no dizziness/lightheadedness when standing ? ?T2DM ?He has been taking metformin 500 mg twice a day instead of 1000 mg twice a day due to diarrhea.  He is wanting to change medications. ? ?PERTINENT  PMH / PSH: T2DM, HTN, DVT, BPH, HLD, obesity ? ?Patient Care Team: ?Gerrit Heck, MD as PCP - General (Family Medicine)  ? ?OBJECTIVE:  ? ?BP (!) 152/85   Pulse 77   Ht '5\' 10"'$  (1.778 m)   Wt 273 lb (123.8 kg)   BMI 39.17 kg/m?   ?Physical Exam ?Constitutional:   ?   General: He is not in acute distress. ?Cardiovascular:  ?   Rate and Rhythm: Normal rate and regular rhythm.  ?Pulmonary:  ?   Effort: Pulmonary effort is normal. No respiratory distress.  ?   Breath sounds: Normal breath sounds.  ?Neurological:  ?   Mental Status: He is alert.  ?  ? ? ?  11/05/2021  ?  3:26 PM  ?Depression screen PHQ 2/9  ?Decreased Interest 0  ?Down, Depressed, Hopeless 0  ?PHQ - 2 Score 0  ?Altered sleeping 3  ?Tired, decreased energy 2  ?Change in appetite 0  ?Feeling bad or failure about yourself  0  ?Trouble concentrating 1  ?Moving slowly or fidgety/restless 0  ?Suicidal thoughts 0  ?PHQ-9 Score 6  ?Difficult doing work/chores Somewhat difficult  ?  ? ?{Show previous vital signs (optional):23777} ? ? ? ?ASSESSMENT/PLAN:  ? ?Type 2 diabetes mellitus ([place]) ?Appears to be well controlled on metformin.  Discussed other treatment options, patient was interested in GLP-1 agonist. ?- sent in semaglutide 0.25 mg (patient will see if medication is affordable), advised to schedule with pharmacy clinic if he is unsure how to use medication ?- if GLP-1 agonist is not a viable option, advised to continue metformin, refill sent ? ?HTN  (hypertension) ?Not at goal, will increase amlodipine to 10 mg.  Continue lisinopril 40 mg. ?  ? ?Return in about 2 weeks (around 11/19/2021) for f/u HTN.  ? ?Zola Button, MD ?[place]  ?

## 2021-11-01 NOTE — Patient Instructions (Addendum)
It was nice seeing you today! ? ?Try Ozempic 0.25 mg once a week. Don't start it if you're unsure how to use it. ? ?Follow-up in 2 weeks for blood pressure check with myself or Dr. Jinny Sanders. ? ?Make an appointment with the pharmacy clinic. ? ?Stay well, ?Zola Button, MD ?[place] ?[phone] ? ?-- ? ?Make sure to check out at the front desk before you leave today. ? ?Please arrive at least 15 minutes prior to your scheduled appointments. ? ?If you had blood work today, I will send you a MyChart message or a letter if results are normal. Otherwise, I will give you a call. ? ?If you had a referral placed, they will call you to set up an appointment. Please give [place] a call if you don't hear back in the next 2 weeks. ? ?If you need additional refills before your next appointment, please call your pharmacy first.  ?

## 2021-11-05 ENCOUNTER — Ambulatory Visit (INDEPENDENT_AMBULATORY_CARE_PROVIDER_SITE_OTHER): Payer: Medicare PPO | Admitting: Family Medicine

## 2021-11-05 ENCOUNTER — Encounter: Payer: Self-pay | Admitting: Family Medicine

## 2021-11-05 VITALS — BP 152/85 | HR 77 | Ht 70.0 in | Wt 273.0 lb

## 2021-11-05 DIAGNOSIS — E119 Type 2 diabetes mellitus without complications: Secondary | ICD-10-CM

## 2021-11-05 DIAGNOSIS — I10 Essential (primary) hypertension: Secondary | ICD-10-CM | POA: Diagnosis not present

## 2021-11-05 MED ORDER — SEMAGLUTIDE(0.25 OR 0.5MG/DOS) 2 MG/1.5ML ~~LOC~~ SOPN: 0.2500 mg | PEN_INJECTOR | SUBCUTANEOUS | 0 refills | Status: DC

## 2021-11-05 MED ORDER — AMLODIPINE BESYLATE 10 MG PO TABS: 10.0000 mg | ORAL_TABLET | Freq: Every day | ORAL | 0 refills | Status: DC

## 2021-11-06 NOTE — Assessment & Plan Note (Signed)
Appears to be well controlled on metformin.  Discussed other treatment options, patient was interested in GLP-1 agonist. ?- sent in semaglutide 0.25 mg (patient will see if medication is affordable), advised to schedule with pharmacy clinic if he is unsure how to use medication ?- if GLP-1 agonist is not a viable option, advised to continue metformin, refill sent ?

## 2021-11-06 NOTE — Assessment & Plan Note (Signed)
Not at goal, will increase amlodipine to 10 mg.  Continue lisinopril 40 mg. ?

## 2021-11-07 ENCOUNTER — Other Ambulatory Visit: Payer: Self-pay | Admitting: Student

## 2021-11-09 ENCOUNTER — Ambulatory Visit: Payer: Medicare PPO | Admitting: Pharmacist

## 2021-11-10 ENCOUNTER — Other Ambulatory Visit: Payer: Self-pay

## 2021-11-10 MED ORDER — METFORMIN HCL ER 500 MG PO TB24: 1000.0000 mg | ORAL_TABLET | Freq: Two times a day (BID) | ORAL | 0 refills | Status: DC

## 2021-11-17 ENCOUNTER — Ambulatory Visit (INDEPENDENT_AMBULATORY_CARE_PROVIDER_SITE_OTHER): Payer: Medicare PPO | Admitting: Family Medicine

## 2021-11-17 ENCOUNTER — Encounter: Payer: Self-pay | Admitting: Family Medicine

## 2021-11-17 VITALS — BP 135/64 | HR 77 | Ht 70.0 in | Wt 274.4 lb

## 2021-11-17 DIAGNOSIS — E119 Type 2 diabetes mellitus without complications: Secondary | ICD-10-CM

## 2021-11-17 DIAGNOSIS — I10 Essential (primary) hypertension: Secondary | ICD-10-CM

## 2021-11-17 NOTE — Patient Instructions (Addendum)
It was nice seeing you today!  Schedule a visit with our pharmacy clinic in 1 month regarding the [place].  Keep checking your blood pressure at home and write them down.  Follow-up in 3 months for BP check.  Stay well, Zola Button, MD [place] [phone]  --  Make sure to check out at the front desk before you leave today.  Please arrive at least 15 minutes prior to your scheduled appointments.  If you had blood work today, I will send you a MyChart message or a letter if results are normal. Otherwise, I will give you a call.  If you had a referral placed, they will call you to set up an appointment. Please give [place] a call if you don't hear back in the next 2 weeks.  If you need additional refills before your next appointment, please call your pharmacy first.

## 2021-11-17 NOTE — Progress Notes (Signed)
    SUBJECTIVE:   CHIEF COMPLAINT / HPI:  Chief Complaint  Patient presents with   Follow-up    HTN Amlodipine increased to 10 mg last visit. Continued on lisinopril 40 mg. Reports he is still having some elevated home readings systolic 366Q.  T2DM Last visit rx sent for semaglutide 0.25 mg weekly as patient wanted to try a different medication due to having diarrhea with metformin. He has just picked up the semaglutide but has not started yet due to concern for GI side effects. It cost him $67 which he states is manageable.  PERTINENT  PMH / PSH: T2DM, HTN  Patient Care Team: Gerrit Heck, MD as PCP - General (Family Medicine)   OBJECTIVE:   BP 135/64   Pulse 77   Ht '5\' 10"'$  (1.778 m)   Wt 274 lb 6 oz (124.5 kg)   SpO2 96%   BMI 39.37 kg/m   Physical Exam Constitutional:      General: He is not in acute distress.    Appearance: He is obese.  Cardiovascular:     Rate and Rhythm: Normal rate and regular rhythm.  Pulmonary:     Effort: Pulmonary effort is normal. No respiratory distress.     Breath sounds: Normal breath sounds.  Neurological:     Mental Status: He is alert.        11/17/2021    4:31 PM  Depression screen PHQ 2/9  Decreased Interest 0  Down, Depressed, Hopeless 0  PHQ - 2 Score 0  Altered sleeping 2  Tired, decreased energy 2  Change in appetite 0  Feeling bad or failure about yourself  0  Trouble concentrating 1  Moving slowly or fidgety/restless 0  Suicidal thoughts 0  PHQ-9 Score 5  Difficult doing work/chores Somewhat difficult     {Show previous vital signs (optional):23777}    ASSESSMENT/PLAN:   HTN (hypertension) At goal in office though is having some elevated home readings.  No change to medications for the time being, advised him to continue monitoring blood pressure at home and follow-up sooner if he is having consistently elevated blood pressure readings greater than 140/90.  Type 2 diabetes mellitus ([place]) He will  start semaglutide 0.5 mg weekly.  Stop metformin for now.  He will follow-up with pharmacy clinic in 1 month for GLP-1 agonist titration.  We will plan to switch back to metformin if he is not tolerating semaglutide.    Return in about 4 weeks (around 12/15/2021) for f/u pharmacy clinic DM GLP-1 titration.  Follow-up 3 months with provider for HTN  Zola Button, MD [place]

## 2021-11-18 NOTE — Assessment & Plan Note (Signed)
At goal in office though is having some elevated home readings.  No change to medications for the time being, advised him to continue monitoring blood pressure at home and follow-up sooner if he is having consistently elevated blood pressure readings greater than 140/90.

## 2021-11-18 NOTE — Assessment & Plan Note (Signed)
He will start semaglutide 0.5 mg weekly.  Stop metformin for now.  He will follow-up with pharmacy clinic in 1 month for GLP-1 agonist titration.  We will plan to switch back to metformin if he is not tolerating semaglutide.

## 2021-12-09 ENCOUNTER — Other Ambulatory Visit: Payer: Self-pay | Admitting: Family Medicine

## 2021-12-15 NOTE — Telephone Encounter (Signed)
Called patient, confirmed name and DOB. Patient has had no issues with wegovy, amenable to increasing this to 0.5 mg weekly. Sent Rx in

## 2021-12-16 ENCOUNTER — Encounter: Payer: Self-pay | Admitting: Pharmacist

## 2021-12-16 ENCOUNTER — Ambulatory Visit: Payer: Medicare PPO | Admitting: Pharmacist

## 2021-12-16 VITALS — BP 145/80 | HR 65 | Wt 272.2 lb

## 2021-12-16 DIAGNOSIS — I10 Essential (primary) hypertension: Secondary | ICD-10-CM

## 2021-12-16 DIAGNOSIS — E119 Type 2 diabetes mellitus without complications: Secondary | ICD-10-CM

## 2021-12-16 MED ORDER — OZEMPIC (0.25 OR 0.5 MG/DOSE) 2 MG/3ML ~~LOC~~ SOPN: 0.5000 mg | PEN_INJECTOR | SUBCUTANEOUS | 2 refills | Status: DC

## 2021-12-16 MED ORDER — LISINOPRIL 40 MG PO TABS: 40.0000 mg | ORAL_TABLET | Freq: Every day | ORAL | 3 refills | Status: DC

## 2021-12-16 NOTE — Assessment & Plan Note (Signed)
Diabetes longstanding currently close to goal based on last A1c 7.3. Patient is able to verbalize appropriate hypoglycemia management plan. Medication adherence appears appropriate. Patient was not able to get Wegovy 0.5 mg from his pharmacy due to it being on shortage. Mancel Parsons is also more expensive on his insurance ($67/month) than Ozempic ($40/month).  -Switch semaglutide from [facility] brand to Ozempic. Increase dose to 0.5 mg once weekly.  -Patient educated on purpose, proper use, and potential adverse effects of semaglutide (Ozempic). Educated patient on use of Ozempic pen.

## 2021-12-16 NOTE — Progress Notes (Signed)
S:     Chief Complaint  Patient presents with   Medication Management    Diabetes follow up   Frank Scott is a 71 y.o. male who presents for diabetes evaluation, education, and management. Patient was last seen by Dr. Nancy Fetter. Metformin was switched to semaglutide due to GI upset and diarrhea with metformin.  GI symptoms improved/resolved since discontinuation of metformin.   Today, patient arrives in good spirits and presents without any assistance. He reports that he is taking Wegovy which was $67 and he was not able to pick up the next dose of 0.5 mg due to it being out of stock. Last Wednesday was his last dose. He tolerated 4 weeks of 0.25 mg well without adverse effect. He reports that he needs refills of lisinopril. He has not yet had his BP medications today.   Current diabetes medications include: semaglutide (as Wegovy) 0.25 mg weekly (Wednesdays)  Current hypertension medications include: amlodipine 10 mg daily, lisinopril 40 mg daily Current hyperlipidemia medications include: atorvastatin 80 mg daily  Patient reports taking all medications as prescribed. Patient reports adherence with medications. Patient reports missing his medications 0 times per week, on average when the medication is in stock.   Do you feel that your medications are working for you? Could not really tell a difference other than feeling fuller sooner Have you been experiencing any side effects to the medications prescribed? no Do you have any problems obtaining medications due to transportation or finances? No - Wegovy cost [phone] Insurance coverage: [place]  Patient reports symptoms of hypoglycemic events but has not been checking BG lately. Says he feels "weak" when he is more active.   Reported home fasting blood sugars: not checking  Reported 2 hour post-meal/random blood sugars: not checking  Patient reports nocturia (nighttime urination). 1x/night.  Patient reports  neuropathy (nerve pain). Occasional tingling in L left.  Patient reports visual changes. Up to date with eye doctor. Glaucoma worse in left eye.  Patient reports self foot exams.   Patient reported dietary habits: Eats 2 meals/day Breakfast: usually skips Lunch: fast food Dinner: Protein, starch, veggies; [employer] lately  Snacks: may have a candy bar if he's out (once/week) Drinks: water, soda, tea, juice, occasional beer   Within the past 12 months, did you worry whether your food would run out before you got money to buy more? no Within the past 12 months, did the food you bought run out, and you didn't have money to get more? no  Patient-reported exercise habits: Walks 2x/night for an hour.   O:  Physical Exam Vitals reviewed.  Constitutional:      Appearance: He is obese.  Cardiovascular:     Rate and Rhythm: Normal rate.  Pulmonary:     Effort: Pulmonary effort is normal.  Neurological:     Mental Status: He is alert.  Psychiatric:        Mood and Affect: Mood normal.        Behavior: Behavior normal.        Thought Content: Thought content normal.   Review of Systems  All other systems reviewed and are negative.  Lab Results  Component Value Date   HGBA1C 7.3 (A) 10/21/2021   Vitals:   12/16/21 1538 12/16/21 1551  BP: (!) 149/77 (!) 145/80  Pulse: 65   SpO2: 100%    Lipid Panel     Component Value Date/Time   CHOL 162 10/21/2021 1554  TRIG 92 10/21/2021 1554   HDL 47 10/21/2021 1554   CHOLHDL 3.4 10/21/2021 1554   CHOLHDL 5.5 (H) 10/13/2015 0929   VLDL 29 10/13/2015 0929   LDLCALC 98 10/21/2021 1554   LDLDIRECT 117 03/29/2016 1409   Clinical Atherosclerotic Cardiovascular Disease (ASCVD): No  The 10-year ASCVD risk score (Arnett DK, et al., 2019) is: 38.6%   Values used to calculate the score:     Age: 54 years     Sex: Male     Is Non-Hispanic African American: Yes     Diabetic: Yes     Tobacco smoker: No     Systolic Blood Pressure: 254  mmHg     Is BP treated: Yes     HDL Cholesterol: 47 mg/dL     Total Cholesterol: 162 mg/dL   A/P: Diabetes longstanding currently close to goal based on last A1c 7.3. Patient is able to verbalize appropriate hypoglycemia management plan. Medication adherence appears appropriate. Patient was not able to get Wegovy 0.5 mg from his pharmacy due to it being on shortage. Mancel Parsons is also more expensive on his insurance ($67/month) than Ozempic ($40/month).  -Switch semaglutide from [facility] brand to Ozempic. Increase dose to 0.5 mg once weekly.  -Patient educated on purpose, proper use, and potential adverse effects of semaglutide (Ozempic). Educated patient on use of Ozempic pen.  -Extensively discussed pathophysiology of diabetes, recommended lifestyle interventions, dietary effects on blood sugar control.  -Counseled on s/sx of and management of hypoglycemia.  -Next A1c anticipated July 2023.  -Encouraged patient to bring his glucometer to next visit.   Hypertension longstanding currently uncontrolled due to the patient not having taken his amlodipine yet today and being out of refills on lisinopril. Blood pressure goal of <130/80 mmHg.  -Continue amlodipine 10 mg daily.  -Refilled lisinopril 40 mg daily.   Written patient instructions provided. Patient verbalized understanding of treatment plan. Total time in face to face counseling 34 minutes.    Follow up pharmacist clinic visit in 4 weeks. Patient seen with Berdie Ogren, PharmD Candidate, and Rebbeca Paul, PharmD, PGY2 Pharmacy Resident.

## 2021-12-16 NOTE — Assessment & Plan Note (Signed)
Hypertension longstanding currently uncontrolled due to the patient not having taken his amlodipine yet today and being out of refills on lisinopril. Blood pressure goal of <130/80 mmHg.  -Continue amlodipine 10 mg daily.  -Refilled lisinopril 40 mg daily.

## 2021-12-16 NOTE — Patient Instructions (Addendum)
It was nice to see you today!  Your goal blood sugar is 80-130 before eating and less than 180 after eating.  Medication Changes: Pick up Ozempic (switching from [facility]). Start taking 0.5 mg once weekly.   Monitor blood sugars at home and keep a log (glucometer or piece of paper) to bring with you to your next visit.  Keep up the good work with diet and exercise. Aim for a diet full of vegetables, fruit and lean meats (chicken, [place], fish). Try to limit salt intake by eating fresh or frozen vegetables (instead of canned), rinse canned vegetables prior to cooking and do not add any additional salt to meals.

## 2021-12-17 NOTE — Progress Notes (Signed)
Reviewed: I agree with Dr. Koval's documentation and management. 

## 2022-01-06 ENCOUNTER — Other Ambulatory Visit: Payer: Self-pay | Admitting: Student

## 2022-01-13 ENCOUNTER — Encounter: Payer: Self-pay | Admitting: Pharmacist

## 2022-01-13 ENCOUNTER — Ambulatory Visit: Payer: Medicare PPO | Admitting: Pharmacist

## 2022-01-13 VITALS — BP 126/78 | HR 71 | Ht 72.0 in | Wt 267.0 lb

## 2022-01-13 DIAGNOSIS — E119 Type 2 diabetes mellitus without complications: Secondary | ICD-10-CM

## 2022-01-13 DIAGNOSIS — E785 Hyperlipidemia, unspecified: Secondary | ICD-10-CM | POA: Diagnosis not present

## 2022-01-13 MED ORDER — GLUCOSE BLOOD VI STRP: ORAL_STRIP | 12 refills | Status: DC

## 2022-01-13 MED ORDER — EZETIMIBE 10 MG PO TABS: 10.0000 mg | ORAL_TABLET | Freq: Every day | ORAL | 11 refills | Status: DC

## 2022-01-13 MED ORDER — OZEMPIC (1 MG/DOSE) 4 MG/3ML ~~LOC~~ SOPN: 1.0000 mg | PEN_INJECTOR | SUBCUTANEOUS | 11 refills | Status: AC

## 2022-01-13 NOTE — Assessment & Plan Note (Signed)
ASCVD risk - primary prevention in patient with diabetes. Last LDL is 98 not at goal of <70 mg/dL. ASCVD risk factors include diabetes, HTN, and obseity. 10-year ASCVD risk score of 31.3. High intensity statin indicated.  -Continued atorvastatin 80 mg.  -Started Zetia (ezetimibe) 10 mg daily.

## 2022-01-13 NOTE — Assessment & Plan Note (Signed)
Diabetes longstanding currently controlled based on A1c. Patient is able to verbalize appropriate hypoglycemia management plan. Medication adherence appears optimal. -Increased dose of GLP-1 Ozempic (semaglutide) to 1 mg once weekly.  -Patient educated on purpose, proper use, and potential adverse effects of Ozempic (semaglutide).  -Extensively discussed pathophysiology of diabetes, recommended lifestyle interventions, dietary effects on blood sugar control.  -Counseled on s/sx of and management of hypoglycemia.  -Next A1c anticipated next office visit.

## 2022-01-13 NOTE — Progress Notes (Signed)
S:    Chief Complaint  Patient presents with   Diabetes, hyperlipidemia   Frank Scott is a 71 y.o. male who presents for diabetes evaluation, education, and management.  PMH is significant for T2DM, obesity, DVT, HTN, and HLD.  Patient was last seen by Dr. Nancy Fetter. Last seen by pharmacy on 12/16/21. At last visit, semaglutide was switched from [facility] brand to Ozempic and the dose was increased from 0.25 to 0.5 mg.   Today, patient arrives in good spirits and presents without any assistance. Reports he has not been checking his blood glucose regularly, but has been checking his blood pressure. Last night BP was 140/70s. Occasionally sees DBP in the 60s. New onset fatigue, denies depression. Defer management to PCP.   Current diabetes medications include: semaglutide (as Wegovy) 0.5 mg weekly (Wednesdays)  Current hypertension medications include: amlodipine 10 mg daily, lisinopril 40 mg daily Current hyperlipidemia medications include: atorvastatin 80 mg   Patient reports adherence to taking all medications as prescribed.   Do you feel that your medications are working for you? yes Have you been experiencing any side effects to the medications prescribed? no Do you have any problems obtaining medications due to transportation or finances? no Insurance coverage: Medicare - [employer]   Patient denies hypoglycemic events.  Reported home fasting blood sugars: Doesn't check   Patient reports nocturia (nighttime urination). Once/night   Within the past 12 months, did you worry whether your food would run out before you got money to buy more? no Within the past 12 months, did the food you bought run out, and you didn't have money to get more? no  O:  Review of Systems  Constitutional:  Positive for malaise/fatigue.  All other systems reviewed and are negative.  Physical Exam Constitutional:      Appearance: Normal appearance. He is obese.  Pulmonary:     Effort: Pulmonary effort is  normal.  Neurological:     Mental Status: He is alert.  Psychiatric:        Mood and Affect: Mood normal.        Behavior: Behavior normal.    Lab Results  Component Value Date   HGBA1C 7.3 (A) 10/21/2021   Vitals:   01/13/22 1548  BP: 126/78  Pulse: 71  SpO2: 99%    Lipid Panel     Component Value Date/Time   CHOL 162 10/21/2021 1554   TRIG 92 10/21/2021 1554   HDL 47 10/21/2021 1554   CHOLHDL 3.4 10/21/2021 1554   CHOLHDL 5.5 (H) 10/13/2015 0929   VLDL 29 10/13/2015 0929   LDLCALC 98 10/21/2021 1554   LDLDIRECT 117 03/29/2016 1409    Clinical Atherosclerotic Cardiovascular Disease (ASCVD): No  The 10-year ASCVD risk score (Arnett DK, et al., 2019) is: 31.1%   Values used to calculate the score:     Age: 51 years     Sex: Male     Is Non-Hispanic African American: Yes     Diabetic: Yes     Tobacco smoker: No     Systolic Blood Pressure: 720 mmHg     Is BP treated: Yes     HDL Cholesterol: 47 mg/dL     Total Cholesterol: 162 mg/dL   A/P: Diabetes longstanding currently controlled based on A1c. Patient is able to verbalize appropriate hypoglycemia management plan. Medication adherence appears optimal. -Increased dose of GLP-1 Ozempic (semaglutide) to 1 mg once weekly.  -Patient educated on purpose, proper use, and potential adverse  effects of Ozempic (semaglutide).  -Extensively discussed pathophysiology of diabetes, recommended lifestyle interventions, dietary effects on blood sugar control.  -Counseled on s/sx of and management of hypoglycemia.  -Next A1c anticipated next office visit.   ASCVD risk - primary prevention in patient with diabetes. Last LDL is 98 not at goal of <70 mg/dL. ASCVD risk factors include diabetes, HTN, and obseity. 10-year ASCVD risk score of 31.3. High intensity statin indicated.  -Continued atorvastatin 80 mg.  -Started Zetia (ezetimibe) 10 mg daily.    New onset fatigue, denies depression. Defer management to PCP.   Written  patient instructions provided. Patient verbalized understanding of treatment plan.  Total time in face to face counseling 32 minutes.    Follow-up:  Pharmacist PRN - may return at any time as needed by PCP.  PCP clinic visit within 1 month - scheduled visit for 8/11 Patient seen with Joseph Art, PharmD, PGY2 Pharmacy Resident.

## 2022-01-13 NOTE — Patient Instructions (Signed)
It was nice to see you today!  Your goal blood sugar is 80-130 before eating and less than 180 after eating.  Medication Changes: Begin Zetia 10 mg daily Increase Ozempic to 1 mg once weekly   Monitor blood sugars at home and keep a log (glucometer or piece of paper) to bring with you to your next visit.  Keep up the good work with diet and exercise. Aim for a diet full of vegetables, fruit and lean meats (chicken, [place], fish). Try to limit salt intake by eating fresh or frozen vegetables (instead of canned), rinse canned vegetables prior to cooking and do not add any additional salt to meals.

## 2022-01-14 ENCOUNTER — Other Ambulatory Visit (HOSPITAL_COMMUNITY): Payer: Self-pay

## 2022-01-14 NOTE — Progress Notes (Signed)
Reviewed: I agree with Dr. Koval's documentation and management. 

## 2022-01-17 ENCOUNTER — Other Ambulatory Visit: Payer: Self-pay | Admitting: Student

## 2022-01-17 DIAGNOSIS — E119 Type 2 diabetes mellitus without complications: Secondary | ICD-10-CM

## 2022-01-17 MED ORDER — ACCU-CHEK AVIVA PLUS VI STRP: ORAL_STRIP | 12 refills | Status: AC

## 2022-02-04 ENCOUNTER — Ambulatory Visit: Payer: Medicare PPO | Admitting: Student

## 2022-02-04 DIAGNOSIS — D6859 Other primary thrombophilia: Secondary | ICD-10-CM | POA: Insufficient documentation

## 2022-02-04 NOTE — Progress Notes (Deleted)
    SUBJECTIVE:   CHIEF COMPLAINT / HPI:   Diabetic Follow Up: Patient is a 71 y.o. male who present today for diabetic follow up.   Patient endorses {rwdmsmartlistproblems:24882}  Home medications include: wegovy 0.5 mg weekly on Wednesdays, atorvastatin 80 mg daily, zetia  10 mg daily Patient endorses taking these medications as prescribed.***  Most recent A1Cs:  Lab Results  Component Value Date   HGBA1C 7.3 (A) 10/21/2021   HGBA1C 8.3 (A) 09/30/2020   HGBA1C 7.0 (A) 06/30/2020   Last Microalbumin, LDL, Creatinine: Lab Results  Component Value Date   LDLCALC 98 10/21/2021   CREATININE 1.04 10/21/2021   Patient {rwdoesdoesnot:24881} check blood glucose on a regular basis.  Patient {rwisisnot:24883} up to date on diabetic eye. Patient {rwisisnot:24883} up to date on diabetic foot exam.  Hypertension: Patient is a 71 y.o. male who present today for follow up of hypertension.   Patient endorses {rwdmsmartlistproblems:24882}  Home medications include: amlodipine 10 mg daily, lisinopril 40 mg daily Patient endorses taking these medications as prescribed.***  Most recent creatinine trend:  Lab Results  Component Value Date   CREATININE 1.04 10/21/2021   CREATININE 1.19 09/30/2020   CREATININE 1.15 08/18/2020   Patient {rwdoesdoesnot:24881} check blood pressure at home.  Patient has had a BMP in the past 1 year.   PERTINENT  PMH / PSH: protein S deficiency on lifelong eliquis  OBJECTIVE:   There were no vitals taken for this visit.  ***  ASSESSMENT/PLAN:   No problem-specific Assessment & Plan notes found for this encounter.     Gerrit Heck, MD [place]

## 2022-02-07 ENCOUNTER — Other Ambulatory Visit: Payer: Self-pay

## 2022-02-07 DIAGNOSIS — E785 Hyperlipidemia, unspecified: Secondary | ICD-10-CM

## 2022-02-07 MED ORDER — ATORVASTATIN CALCIUM 80 MG PO TABS: 80.0000 mg | ORAL_TABLET | Freq: Every day | ORAL | 3 refills | Status: AC

## 2022-02-15 ENCOUNTER — Other Ambulatory Visit: Payer: Self-pay | Admitting: Family Medicine

## 2022-02-15 DIAGNOSIS — E119 Type 2 diabetes mellitus without complications: Secondary | ICD-10-CM

## 2022-03-07 ENCOUNTER — Other Ambulatory Visit: Payer: Self-pay | Admitting: Student

## 2022-05-20 ENCOUNTER — Other Ambulatory Visit: Payer: Self-pay | Admitting: Student

## 2022-05-20 DIAGNOSIS — E119 Type 2 diabetes mellitus without complications: Secondary | ICD-10-CM

## 2022-06-11 ENCOUNTER — Ambulatory Visit (HOSPITAL_COMMUNITY)
Admission: EM | Admit: 2022-06-11 | Discharge: 2022-06-11 | Disposition: A | Discharge status: Home / Self Care | Payer: Medicare PPO

## 2022-06-11 ENCOUNTER — Encounter (HOSPITAL_COMMUNITY): Payer: Self-pay

## 2022-06-11 ENCOUNTER — Other Ambulatory Visit: Payer: Self-pay

## 2022-06-11 DIAGNOSIS — U071 COVID-19: Secondary | ICD-10-CM

## 2022-06-11 NOTE — ED Triage Notes (Signed)
Patient with c/o sore throat and cough. States he went on a cruise last week. Took 2 home covid tests at home that were positive. Symptoms started Wednesday and patient states he has progressively been getting better since.

## 2022-06-11 NOTE — Discharge Instructions (Signed)
Now that your symptoms are improving recommend continued use of over the counter medications for symptoms.  If you develop new or worsening symptoms return for evaluation.  Recommend Flonase and Mucinex Can take Tylenol as needed for fever or body aches.  Recommend isolation from others for 5 days and then wear a mask around others for 5 days.

## 2022-06-11 NOTE — ED Provider Notes (Signed)
[facility]    CSN: [id number] Arrival date & time: 06/11/22  1512      History   Chief Complaint Chief Complaint  Patient presents with   Cough   Sore Throat    HPI Frank Scott is a 71 y.o. male.   Patient presents with sore throat and cough.  Symptoms started 4 days ago.  He took a home COVID test x2 that was positive.  Reports he was on a cruise 2 weeks ago.  Denies shortness of breath or wheezing, nausea, pain, diarrhea.  Reports overall his symptoms are improving.  He has been taking over-the-counter cough medications with relief.    Past Medical History:  Diagnosis Date   Blood transfusion declined because patient is Jehovah's Witness    DVT of deep femoral vein ([facility]) 1980s   Hypertension     Patient Active Problem List   Diagnosis Date Noted   Protein S deficiency ([place]) 02/04/2022   Benign prostate hyperplasia 04/10/2020   History of DVT (deep vein thrombosis) 04/24/2019   Hyperlipidemia 11/16/2015   Type 2 diabetes mellitus ([place]) 11/05/2015   Erectile dysfunction 10/15/2015   HTN (hypertension) 10/04/2012   History of intracerebral hemorrhage without residual deficit 09/07/2012   OBESITY 11/02/2009    Past Surgical History:  Procedure Laterality Date   APPENDECTOMY  1980s   CRANIOTOMY Right 09/07/2012   Procedure: Elevation of Decompressed Skull [place] Forward;  Surgeon: Faythe Ghee, MD;  Location: [facility];  Service: Neurosurgery;  Laterality: Right;  Elevation of Right Fronto-Temporal Skull Fracture       Home Medications    Prior to Admission medications   Medication Sig Start Date End Date Taking? Authorizing Provider  amLODipine (NORVASC) 10 MG tablet TAKE 1 TABLET(10 MG) BY MOUTH DAILY 05/23/22   Gerrit Heck, MD  atorvastatin (LIPITOR) 80 MG tablet Take 1 tablet (80 mg total) by mouth daily. 02/07/22   Gerrit Heck, MD  Blood Glucose Monitoring Suppl (ONE TOUCH ULTRA 2) w/Device KIT Check blood sugar fasting in the  morning and one other time two hours after a meal. 11/04/15   Nicolette Bang, MD  ELIQUIS 5 MG TABS tablet TAKE 1 TABLET(5 MG) BY MOUTH TWICE DAILY 03/07/22   Gerrit Heck, MD  ezetimibe (ZETIA) 10 MG tablet Take 1 tablet (10 mg total) by mouth daily. 01/13/22   Zenia Resides, MD  glucose blood (ACCU-CHEK AVIVA PLUS) test strip Use as instructed 01/17/22   Gerrit Heck, MD  lisinopril (ZESTRIL) 40 MG tablet Take 1 tablet (40 mg total) by mouth daily. 12/16/21   Zenia Resides, MD  Semaglutide, 1 MG/DOSE, (OZEMPIC, 1 MG/DOSE,) 4 MG/3ML SOPN Inject 1 mg into the skin once a week. 01/13/22   Zenia Resides, MD  tamsulosin (FLOMAX) 0.4 MG CAPS capsule TAKE 1 CAPSULE(0.4 MG) BY MOUTH DAILY 10/14/20   Matilde Haymaker, MD    Family History Family History  Problem Relation Age of Onset   Diabetes Mother    Hypertension Mother    Stroke Father    Deep vein thrombosis Sister    Deep vein thrombosis Brother    Deep vein thrombosis Daughter    Clotting disorder Daughter    Deep vein thrombosis Son     Social History Social History   Tobacco Use   Smoking status: Never   Smokeless tobacco: Never  Vaping Use   Vaping Use: Never used  Substance Use Topics   Alcohol use: Yes  Alcohol/week: 0.0 standard drinks of alcohol    Comment: occasional   Drug use: No     Allergies   Patient has no known allergies.   Review of Systems Review of Systems  Constitutional:  Negative for chills and fever.  HENT:  Positive for sore throat. Negative for ear pain.   Eyes:  Negative for pain and visual disturbance.  Respiratory:  Positive for cough. Negative for shortness of breath.   Cardiovascular:  Negative for chest pain and palpitations.  Gastrointestinal:  Negative for abdominal pain and vomiting.  Genitourinary:  Negative for dysuria and hematuria.  Musculoskeletal:  Negative for arthralgias and back pain.  Skin:  Negative for color change and rash.  Neurological:   Negative for seizures and syncope.  All other systems reviewed and are negative.    Physical Exam Triage Vital Signs ED Triage Vitals  Enc Vitals Group     BP 06/11/22 1649 136/82     Pulse Rate 06/11/22 1649 83     Resp 06/11/22 1649 16     Temp 06/11/22 1649 98.1 F (36.7 C)     Temp Source 06/11/22 1649 Oral     SpO2 06/11/22 1649 96 %     Weight --      Height --      Head Circumference --      Peak Flow --      Pain Score 06/11/22 1650 0     Pain Loc --      Pain Edu? --      Excl. in [place]? --    No data found.  Updated Vital Signs BP 136/82 (BP Location: Right Arm)   Pulse 83   Temp 98.1 F (36.7 C) (Oral)   Resp 16   SpO2 96%   Visual Acuity Right Eye Distance:   Left Eye Distance:   Bilateral Distance:    Right Eye Near:   Left Eye Near:    Bilateral Near:     Physical Exam Vitals and nursing note reviewed.  Constitutional:      General: He is not in acute distress.    Appearance: He is well-developed.  HENT:     Head: Normocephalic and atraumatic.  Eyes:     Conjunctiva/sclera: Conjunctivae normal.  Cardiovascular:     Rate and Rhythm: Normal rate and regular rhythm.     Heart sounds: No murmur heard. Pulmonary:     Effort: Pulmonary effort is normal. No respiratory distress.     Breath sounds: Normal breath sounds.  Abdominal:     Palpations: Abdomen is soft.     Tenderness: There is no abdominal tenderness.  Musculoskeletal:        General: No swelling.     Cervical back: Neck supple.  Skin:    General: Skin is warm and dry.     Capillary Refill: Capillary refill takes less than 2 seconds.  Neurological:     Mental Status: He is alert.  Psychiatric:        Mood and Affect: Mood normal.      UC Treatments / Results  Labs (all labs ordered are listed, but only abnormal results are displayed) Labs Reviewed - No data to display  EKG   Radiology No results found.  Procedures Procedures (including critical care  time)  Medications Ordered in UC Medications - No data to display  Initial Impression / Assessment and Plan / UC Course  I have reviewed the triage vital signs and the nursing  notes.  Pertinent labs & imaging results that were available during my care of the patient were reviewed by me and considered in my medical decision making (see chart for details).     Positive home COVID test.  Patient overall well-appearing, no acute distress, lungs clear, vitals are normal.  Stable for discharge.  At this time we will not initiate Paxlovid as pt is on day 4, sx mild and improving since onset.  Return precautions discussed Final Clinical Impressions(s) / UC Diagnoses   Final diagnoses:  COVID     Discharge Instructions      Now that your symptoms are improving recommend continued use of over the counter medications for symptoms.  If you develop new or worsening symptoms return for evaluation.  Recommend Flonase and Mucinex Can take Tylenol as needed for fever or body aches.  Recommend isolation from others for 5 days and then wear a mask around others for 5 days.    ED Prescriptions   None    PDMP not reviewed this encounter.   Ward, Lenise Arena, PA-C 06/11/22 [phone]

## 2022-08-08 DIAGNOSIS — D6859 Other primary thrombophilia: Secondary | ICD-10-CM | POA: Diagnosis not present

## 2022-08-08 DIAGNOSIS — I10 Essential (primary) hypertension: Secondary | ICD-10-CM | POA: Diagnosis not present

## 2022-08-08 DIAGNOSIS — E785 Hyperlipidemia, unspecified: Secondary | ICD-10-CM | POA: Diagnosis not present

## 2022-08-08 DIAGNOSIS — E119 Type 2 diabetes mellitus without complications: Secondary | ICD-10-CM | POA: Diagnosis not present

## 2022-08-08 DIAGNOSIS — N529 Male erectile dysfunction, unspecified: Secondary | ICD-10-CM | POA: Diagnosis not present

## 2022-08-08 DIAGNOSIS — N400 Enlarged prostate without lower urinary tract symptoms: Secondary | ICD-10-CM | POA: Diagnosis not present

## 2022-08-15 ENCOUNTER — Telehealth: Payer: Self-pay | Admitting: Student

## 2022-08-15 NOTE — Telephone Encounter (Signed)
Called patient to schedule Medicare Annual Wellness Visit (AWV). Left message for patient to call back and schedule Medicare Annual Wellness Visit (AWV).  Last date of AWV: 09/22/2020   Please schedule an appointment at any time with [place], [place].  If any questions, please contact me at [phone].    Thank you,  [place] Direct dial  [phone]

## 2022-08-18 ENCOUNTER — Other Ambulatory Visit: Payer: Self-pay | Admitting: Student

## 2022-08-18 DIAGNOSIS — E119 Type 2 diabetes mellitus without complications: Secondary | ICD-10-CM

## 2022-11-07 DIAGNOSIS — Z9181 History of falling: Secondary | ICD-10-CM | POA: Diagnosis not present

## 2022-11-07 DIAGNOSIS — N400 Enlarged prostate without lower urinary tract symptoms: Secondary | ICD-10-CM | POA: Diagnosis not present

## 2022-11-07 DIAGNOSIS — E119 Type 2 diabetes mellitus without complications: Secondary | ICD-10-CM | POA: Diagnosis not present

## 2022-11-07 DIAGNOSIS — N529 Male erectile dysfunction, unspecified: Secondary | ICD-10-CM | POA: Diagnosis not present

## 2022-11-07 DIAGNOSIS — D6869 Other thrombophilia: Secondary | ICD-10-CM | POA: Diagnosis not present

## 2022-11-07 DIAGNOSIS — Z0000 Encounter for general adult medical examination without abnormal findings: Secondary | ICD-10-CM | POA: Diagnosis not present

## 2022-11-07 DIAGNOSIS — I10 Essential (primary) hypertension: Secondary | ICD-10-CM | POA: Diagnosis not present

## 2022-11-07 DIAGNOSIS — E785 Hyperlipidemia, unspecified: Secondary | ICD-10-CM | POA: Diagnosis not present

## 2022-12-01 ENCOUNTER — Telehealth: Payer: Self-pay

## 2022-12-01 NOTE — Telephone Encounter (Signed)
A Prior Authorization was initiated for this patients OZEMPIC through CoverMyMeds.   Key: [id number]

## 2022-12-02 NOTE — Telephone Encounter (Signed)
Prior Auth for patients medication OZEMPIC approved by [facility] [employer] from 12/01/22 to 11/30/25.  CoverMyMeds Key: [id number]

## 2022-12-19 ENCOUNTER — Other Ambulatory Visit: Payer: Self-pay

## 2022-12-19 DIAGNOSIS — E785 Hyperlipidemia, unspecified: Secondary | ICD-10-CM

## 2022-12-20 ENCOUNTER — Other Ambulatory Visit: Payer: Self-pay | Admitting: Student

## 2022-12-20 DIAGNOSIS — E785 Hyperlipidemia, unspecified: Secondary | ICD-10-CM

## 2022-12-20 MED ORDER — EZETIMIBE 10 MG PO TABS: 10.0000 mg | ORAL_TABLET | Freq: Every day | ORAL | 0 refills | Status: DC

## 2022-12-20 MED ORDER — LISINOPRIL 40 MG PO TABS: 40.0000 mg | ORAL_TABLET | Freq: Every day | ORAL | 0 refills | Status: AC

## 2023-01-08 NOTE — Progress Notes (Deleted)
Subjective:   Frank Scott is a 72 y.o. male who presents for Medicare Annual/Subsequent preventive examination.  Visit Complete: {[phone]}  Patient Medicare AWV questionnaire was completed by the patient on ***; I have confirmed that all information answered by patient is correct and no changes since this date.  Review of Systems    ***       Objective:    There were no vitals filed for this visit. There is no height or weight on file to calculate BMI.     11/17/2021    4:31 PM 11/05/2021    3:27 PM 10/21/2021    2:55 PM 09/30/2020    3:36 PM 09/22/2020    3:59 PM 04/10/2020    2:47 PM 06/10/2019    1:37 PM  Advanced Directives  Does Patient Have a Medical Advance Directive? No No No No Yes Yes No  Type of Advance Directive     Healthcare Power of [employer] Power of Attorney   Does patient want to make changes to medical advance directive?    No - Patient declined No - Patient declined    Copy of Healthcare Power of Attorney in Chart?     No - copy requested Yes - validated most recent copy scanned in chart (See row information)   Would patient like information on creating a medical advance directive? No - Patient declined No - Patient declined No - Patient declined  No - Patient declined  No - Patient declined    Current Medications (verified) Outpatient Encounter Medications as of 01/09/2023  Medication Sig   amLODipine (NORVASC) 10 MG tablet TAKE 1 TABLET(10 MG) BY MOUTH DAILY   atorvastatin (LIPITOR) 80 MG tablet Take 1 tablet (80 mg total) by mouth daily.   Blood Glucose Monitoring Suppl (ONE TOUCH ULTRA 2) w/Device KIT Check blood sugar fasting in the morning and one other time two hours after a meal.   ELIQUIS 5 MG TABS tablet TAKE 1 TABLET(5 MG) BY MOUTH TWICE DAILY   ezetimibe (ZETIA) 10 MG tablet TAKE 1 TABLET(10 MG) BY MOUTH DAILY   glucose blood (ACCU-CHEK AVIVA PLUS) test strip Use as instructed   lisinopril (ZESTRIL) 40 MG tablet Take  1 tablet (40 mg total) by mouth daily.   Semaglutide, 1 MG/DOSE, (OZEMPIC, 1 MG/DOSE,) 4 MG/3ML SOPN Inject 1 mg into the skin once a week.   tamsulosin (FLOMAX) 0.4 MG CAPS capsule TAKE 1 CAPSULE(0.4 MG) BY MOUTH DAILY   No facility-administered encounter medications on file as of 01/09/2023.    Allergies (verified) Patient has no known allergies.   History: Past Medical History:  Diagnosis Date   Blood transfusion declined because patient is Jehovah's Witness    DVT of deep femoral vein ([facility]) 1980s   Hypertension    Past Surgical History:  Procedure Laterality Date   APPENDECTOMY  1980s   CRANIOTOMY Right 09/07/2012   Procedure: Elevation of Decompressed Skull Fractture;  Surgeon: Reinaldo Meeker, MD;  Location[facility];  Service: Neurosurgery;  Laterality: Right;  Elevation of Right Fronto-Temporal Skull Fracture   Family History  Problem Relation Age of Onset   Diabetes Mother    Hypertension Mother    Stroke Father    Deep vein thrombosis Sister    Deep vein thrombosis Brother    Deep vein thrombosis Daughter    Clotting disorder Daughter    Deep vein thrombosis Son    Social History   Socioeconomic History   Marital status:  Married    Spouse name: Not on file   Number of children: 2   Years of education: 16   Highest education level: Bachelor's degree (e.g., BA, AB, BS)  Occupational History   Occupation: retired   Tobacco Use   Smoking status: Never   Smokeless tobacco: Never  Vaping Use   Vaping status: Never Used  Substance and Sexual Activity   Alcohol use: Yes    Alcohol/week: 0.0 standard drinks of alcohol    Comment: occasional   Drug use: No   Sexual activity: Yes    Birth control/protection: Post-menopausal  Other Topics Concern   Not on file  Social History Narrative   Patient lives with his wife in [place].    Patient has two children and some grandchildren.    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   Patient has a part time job to keep busy at a [employer].    Patient enjoys spending time with family, cooking, and reading.    Social Determinants of Health   Financial Resource Strain: Not on file  Food Insecurity: No Food Insecurity (09/22/2020)   Hunger Vital Sign    Worried About Running Out of Food in the Last Year: Never true    Ran Out of Food in the Last Year: Never true  Transportation Needs: No Transportation Needs (09/22/2020)   PRAPARE - Administrator, Civil Service (Medical): No    Lack of Transportation (Non-Medical): No  Physical Activity: Insufficiently Active (09/22/2020)   Exercise Vital Sign    Days of Exercise per Week: 3 days    Minutes of Exercise per Session: 20 min  Stress: No Stress Concern Present (09/22/2020)   [employer] of Occupational Health - Occupational Stress Questionnaire    Feeling of Stress : Only a little  Social Connections: Moderately Integrated (09/22/2020)   Social Connection and Isolation Panel [NHANES]    Frequency of Communication with Friends and Family: More than three times a week    Frequency of Social Gatherings with Friends and Family: More than three times a week    Attends Religious Services: More than 4 times per year    Active Member of [employer] or Organizations: Not on file    Attends Banker Meetings: Never    Marital Status: Married    Tobacco Counseling Counseling given: Not Answered   Clinical Intake:                        Activities of Daily Living     No data to display          Patient Care Team: Levin Erp, MD as PCP - General (Family Medicine)  Indicate any recent Medical Services you may have received from other than [facility] providers in the past year (date may be approximate).     Assessment:   This is a routine wellness examination for [name].  Hearing/Vision screen No results found.  Dietary issues and exercise activities  discussed:     Goals Addressed   None    Depression Screen    11/17/2021    4:31 PM 11/05/2021    3:26 PM 10/21/2021    2:54 PM 09/30/2020    3:35 PM 09/22/2020    3:56 PM 08/18/2020    3:22 PM 06/10/2019    1:37 PM  PHQ 2/9 Scores  PHQ - 2 Score 0 0 0 0 0  0 0  PHQ- 9 Score 5 6 5 1  2      Fall Risk    11/05/2021    3:26 PM 10/21/2021    2:54 PM 09/30/2020    3:36 PM 09/22/2020    4:00 PM 09/22/2020    3:59 PM  Fall Risk   Falls in the past year? 0 0 0 0 0  Number falls in past yr: 0 0 0    Injury with Fall?  0 0      MEDICARE RISK AT HOME:   TIMED UP AND GO:  Was the test performed?  {[phone]}    Cognitive Function:        09/22/2020    4:00 PM  6CIT Screen  What Year? 0 points  What month? 0 points  What time? 0 points  Count back from 20 0 points  Months in reverse 0 points  Repeat phrase 0 points  Total Score 0 points    Immunizations Immunization History  Administered Date(s) Administered   Fluad Quad(high Dose 65+) 03/22/2019, 04/10/2020   PFIZER Comirnaty(Gray Top)Covid-19 Tri-Sucrose Vaccine 09/30/2020   PFIZER(Purple Top)SARS-COV-2 Vaccination 08/10/2019, 09/02/2019, 04/10/2020   Pfizer Covid-19 Vaccine Bivalent Booster 4yrs & up 05/27/2021   Pneumococcal Conjugate-13 06/10/2019   Pneumococcal Polysaccharide-23 08/18/2020   Tdap 09/07/2012    TDAP status: Due, Education has been provided regarding the importance of this vaccine. Advised may receive this vaccine at local pharmacy or Health Dept. Aware to provide a copy of the vaccination record if obtained from local pharmacy or Health Dept. Verbalized acceptance and understanding.  Pneumococcal vaccine status: Up to date  Covid-19 vaccine status: Information provided on how to obtain vaccines.   Qualifies for Shingles Vaccine? Yes   Zostavax completed No   Shingrix Completed?: No.    Education has been provided regarding the importance of this vaccine. Patient has been advised  to call insurance company to determine out of pocket expense if they have not yet received this vaccine. Advised may also receive vaccine at local pharmacy or Health Dept. Verbalized acceptance and understanding.  Screening Tests Health Maintenance  Topic Date Due   Diabetic kidney evaluation - Urine ACR  Never done   Zoster Vaccines- Shingrix (1 of 2) Never done   FOOT EXAM  07/23/2021   OPHTHALMOLOGY EXAM  10/29/2021   COVID-19 Vaccine (6 - 2023-24 season) 02/25/2022   HEMOGLOBIN A1C  04/22/2022   DTaP/Tdap/Td (2 - Td or Tdap) 09/08/2022   Diabetic kidney evaluation - eGFR measurement  10/22/2022   INFLUENZA VACCINE  01/26/2023   Colonoscopy  01/13/2028   Pneumonia Vaccine 28+ Years old  Completed   Hepatitis C Screening  Completed   HPV VACCINES  Aged Out    Health Maintenance  Health Maintenance Due  Topic Date Due   Diabetic kidney evaluation - Urine ACR  Never done   Zoster Vaccines- Shingrix (1 of 2) Never done   FOOT EXAM  07/23/2021   OPHTHALMOLOGY EXAM  10/29/2021   COVID-19 Vaccine (6 - 2023-24 season) 02/25/2022   HEMOGLOBIN A1C  04/22/2022   DTaP/Tdap/Td (2 - Td or Tdap) 09/08/2022   Diabetic kidney evaluation - eGFR measurement  10/22/2022    Colorectal cancer screening: Type of screening: Colonoscopy. Completed 01/12/21. Repeat every 7 years  Lung Cancer Screening: (Low Dose CT Chest recommended if Age 28-80 years, 20 pack-year currently smoking OR have quit w/in 15years.) does not qualify.   Lung Cancer Screening Referral: n/a  Additional Screening:  Hepatitis C Screening: does qualify; Completed 06/10/19  Vision Screening: Recommended annual ophthalmology exams for early detection of glaucoma and other disorders of the eye. Is the patient up to date with their annual eye exam?  {YES/NO:21197} Who is the provider or what is the name of the office in which the patient attends annual eye exams? *** If pt is not established with a provider, would they like  to be referred to a provider to establish care? {YES/NO:21197}.   Dental Screening: Recommended annual dental exams for proper oral hygiene  Diabetic Foot Exam: Diabetic Foot Exam: Overdue, Pt has been advised about the importance in completing this exam. Pt is scheduled for diabetic foot exam on at next office visit.  Community Resource Referral / Chronic Care Management: CRR required this visit?  {YES/NO:21197}  CCM required this visit?  {CCM Required choices:[phone]}     Plan:     I have personally reviewed and noted the following in the patient's chart:   Medical and social history Use of alcohol, tobacco or illicit drugs  Current medications and supplements including opioid prescriptions. {Opioid Prescriptions:[phone]} Functional ability and status Nutritional status Physical activity Advanced directives List of other physicians Hospitalizations, surgeries, and ER visits in previous 12 months Vitals Screenings to include cognitive, depression, and falls Referrals and appointments  In addition, I have reviewed and discussed with patient certain preventive protocols, quality metrics, and best practice recommendations. A written personalized care plan for preventive services as well as general preventive health recommendations were provided to patient.     Kandis Fantasia [place], [place]   7/82/9562   After Visit Summary: {CHL AMB AWV After Visit Summary:[phone]}  Nurse Notes: ***

## 2023-03-08 DIAGNOSIS — H25812 Combined forms of age-related cataract, left eye: Secondary | ICD-10-CM | POA: Diagnosis not present

## 2023-03-08 DIAGNOSIS — E113293 Type 2 diabetes mellitus with mild nonproliferative diabetic retinopathy without macular edema, bilateral: Secondary | ICD-10-CM | POA: Diagnosis not present

## 2023-03-08 DIAGNOSIS — H40013 Open angle with borderline findings, low risk, bilateral: Secondary | ICD-10-CM | POA: Diagnosis not present

## 2023-03-23 ENCOUNTER — Telehealth: Payer: Self-pay | Admitting: Student

## 2023-03-23 NOTE — Telephone Encounter (Signed)
Patient's reason for cancelling 03/20/2023 AWV -(est. with new provider [facility])) .  Please remove PCP.

## 2023-04-07 DIAGNOSIS — H25812 Combined forms of age-related cataract, left eye: Secondary | ICD-10-CM | POA: Diagnosis not present

## 2023-04-12 LAB — HM DIABETES EYE EXAM

## 2023-05-10 DIAGNOSIS — D6859 Other primary thrombophilia: Secondary | ICD-10-CM | POA: Diagnosis not present

## 2023-05-10 DIAGNOSIS — E785 Hyperlipidemia, unspecified: Secondary | ICD-10-CM | POA: Diagnosis not present

## 2023-05-10 DIAGNOSIS — E119 Type 2 diabetes mellitus without complications: Secondary | ICD-10-CM | POA: Diagnosis not present

## 2023-05-11 DIAGNOSIS — E119 Type 2 diabetes mellitus without complications: Secondary | ICD-10-CM | POA: Diagnosis not present

## 2023-05-11 DIAGNOSIS — E785 Hyperlipidemia, unspecified: Secondary | ICD-10-CM | POA: Diagnosis not present

## 2023-05-11 DIAGNOSIS — N400 Enlarged prostate without lower urinary tract symptoms: Secondary | ICD-10-CM | POA: Diagnosis not present

## 2023-05-11 DIAGNOSIS — I10 Essential (primary) hypertension: Secondary | ICD-10-CM | POA: Diagnosis not present

## 2023-05-11 DIAGNOSIS — D6859 Other primary thrombophilia: Secondary | ICD-10-CM | POA: Diagnosis not present

## 2023-09-18 DIAGNOSIS — F5104 Psychophysiologic insomnia: Secondary | ICD-10-CM | POA: Diagnosis not present

## 2023-10-16 DIAGNOSIS — F5104 Psychophysiologic insomnia: Secondary | ICD-10-CM | POA: Diagnosis not present

## 2023-11-08 DIAGNOSIS — E785 Hyperlipidemia, unspecified: Secondary | ICD-10-CM | POA: Diagnosis not present

## 2023-11-08 DIAGNOSIS — D6859 Other primary thrombophilia: Secondary | ICD-10-CM | POA: Diagnosis not present

## 2023-11-08 DIAGNOSIS — N400 Enlarged prostate without lower urinary tract symptoms: Secondary | ICD-10-CM | POA: Diagnosis not present

## 2023-11-08 DIAGNOSIS — Z0000 Encounter for general adult medical examination without abnormal findings: Secondary | ICD-10-CM | POA: Diagnosis not present

## 2023-11-08 DIAGNOSIS — I10 Essential (primary) hypertension: Secondary | ICD-10-CM | POA: Diagnosis not present

## 2023-11-08 DIAGNOSIS — E119 Type 2 diabetes mellitus without complications: Secondary | ICD-10-CM | POA: Diagnosis not present

## 2023-11-08 DIAGNOSIS — Z1159 Encounter for screening for other viral diseases: Secondary | ICD-10-CM | POA: Diagnosis not present

## 2023-11-08 DIAGNOSIS — F5104 Psychophysiologic insomnia: Secondary | ICD-10-CM | POA: Diagnosis not present

## 2023-12-24 ENCOUNTER — Other Ambulatory Visit: Payer: Self-pay | Admitting: Student

## 2023-12-24 DIAGNOSIS — E785 Hyperlipidemia, unspecified: Secondary | ICD-10-CM

## 2024-05-06 DIAGNOSIS — I10 Essential (primary) hypertension: Secondary | ICD-10-CM | POA: Diagnosis not present

## 2024-05-06 DIAGNOSIS — E785 Hyperlipidemia, unspecified: Secondary | ICD-10-CM | POA: Diagnosis not present

## 2024-05-06 DIAGNOSIS — E119 Type 2 diabetes mellitus without complications: Secondary | ICD-10-CM | POA: Diagnosis not present

## 2024-05-10 DIAGNOSIS — N529 Male erectile dysfunction, unspecified: Secondary | ICD-10-CM | POA: Diagnosis not present

## 2024-05-10 DIAGNOSIS — E119 Type 2 diabetes mellitus without complications: Secondary | ICD-10-CM | POA: Diagnosis not present

## 2024-05-10 DIAGNOSIS — F5104 Psychophysiologic insomnia: Secondary | ICD-10-CM | POA: Diagnosis not present

## 2024-05-10 DIAGNOSIS — I10 Essential (primary) hypertension: Secondary | ICD-10-CM | POA: Diagnosis not present

## 2024-05-10 DIAGNOSIS — N400 Enlarged prostate without lower urinary tract symptoms: Secondary | ICD-10-CM | POA: Diagnosis not present

## 2024-05-10 DIAGNOSIS — E785 Hyperlipidemia, unspecified: Secondary | ICD-10-CM | POA: Diagnosis not present

## 2024-05-10 DIAGNOSIS — D6859 Other primary thrombophilia: Secondary | ICD-10-CM | POA: Diagnosis not present

## 2024-05-31 NOTE — Progress Notes (Signed)
 Frank Scott                                          MRN: [id number]   05/31/2024   The VBCI Quality Team Specialist reviewed this patient medical record for the purposes of chart review for care gap closure. The following were reviewed: chart review for care gap closure-kidney health evaluation for diabetes:eGFR  and uACR.    VBCI Quality Team
